# Patient Record
Sex: Female | Born: 1985 | Race: White | Hispanic: No | Smoking: Former smoker
Health system: Southern US, Community
[De-identification: ages and names within clinical notes are randomized; demographics above are authoritative.]

## PROBLEM LIST (undated history)

## (undated) ENCOUNTER — Inpatient Hospital Stay: Payer: Self-pay

## (undated) DIAGNOSIS — Z8619 Personal history of other infectious and parasitic diseases: Secondary | ICD-10-CM

## (undated) DIAGNOSIS — F329 Major depressive disorder, single episode, unspecified: Secondary | ICD-10-CM

## (undated) DIAGNOSIS — F32A Depression, unspecified: Secondary | ICD-10-CM

## (undated) DIAGNOSIS — F419 Anxiety disorder, unspecified: Secondary | ICD-10-CM

## (undated) HISTORY — DX: Personal history of other infectious and parasitic diseases: Z86.19

## (undated) HISTORY — PX: EAR FOREIGN BODY REMOVAL: SUR1103

## (undated) HISTORY — DX: Major depressive disorder, single episode, unspecified: F32.9

## (undated) HISTORY — DX: Depression, unspecified: F32.A

## (undated) HISTORY — DX: Anxiety disorder, unspecified: F41.9

---

## 2014-06-20 ENCOUNTER — Emergency Department: Payer: Self-pay | Admitting: Emergency Medicine

## 2014-06-20 LAB — URINALYSIS, COMPLETE
Bilirubin,UR: NEGATIVE
GLUCOSE, UR: NEGATIVE mg/dL (ref 0–75)
Ketone: NEGATIVE
NITRITE: POSITIVE
Ph: 6 (ref 4.5–8.0)
Specific Gravity: 1.027 (ref 1.003–1.030)
Squamous Epithelial: 5
WBC UR: 20 /HPF (ref 0–5)

## 2014-12-12 ENCOUNTER — Ambulatory Visit: Payer: Medicaid Other | Admitting: Obstetrics and Gynecology

## 2014-12-25 ENCOUNTER — Ambulatory Visit: Payer: Self-pay | Admitting: Obstetrics and Gynecology

## 2015-03-04 ENCOUNTER — Ambulatory Visit: Payer: Medicaid Other | Admitting: Obstetrics and Gynecology

## 2015-03-12 ENCOUNTER — Ambulatory Visit: Payer: Medicaid Other | Admitting: Obstetrics and Gynecology

## 2015-03-20 ENCOUNTER — Ambulatory Visit (INDEPENDENT_AMBULATORY_CARE_PROVIDER_SITE_OTHER): Payer: Medicaid Other | Admitting: Obstetrics and Gynecology

## 2015-03-20 ENCOUNTER — Encounter: Payer: Self-pay | Admitting: Obstetrics and Gynecology

## 2015-03-20 VITALS — BP 121/80 | HR 91 | Ht 65.0 in | Wt 148.3 lb

## 2015-03-20 DIAGNOSIS — Z308 Encounter for other contraceptive management: Secondary | ICD-10-CM

## 2015-03-20 MED ORDER — NORELGESTROMIN-ETH ESTRADIOL 150-35 MCG/24HR TD PTWK: 1.0000 | MEDICATED_PATCH | TRANSDERMAL | Status: DC

## 2015-03-20 NOTE — Progress Notes (Signed)
Subjective:    Heather Cross is a 29 y.o. G23P1001 female who presents for f/u of contraception.  Had Nexplanon removed and was initiated on Seasonale ~ 3 months ago.  The patient has no complaints today.  Notes that the pills work for her (i.e. no adverse side effects), however is sometimes forgetting to take them.  Has tried setting alarms in phone for reminders, but this does not help.  Desires to switch to an alternative form of contraception.   Menstrual History: OB History    Gravida Para Term Preterm AB TAB SAB Ectopic Multiple Living   1 1 1       1       Patient's last menstrual period was 03/09/2015 (approximate).    The following portions of the patient's history were reviewed and updated as appropriate: allergies, current medications, past family history, past medical history, past social history, past surgical history and problem list.  Review of Systems Pertinent items noted in HPI and remainder of comprehensive ROS otherwise negative.   Objective:  Blood pressure 121/80, pulse 91, height 5\' 5"  (1.651 m), weight 148 lb 5 oz (67.274 kg), last menstrual period 03/09/2015.   No exam performed today, not indicated  Assessment:    29 y.o., discontinuing OCP (estrogen/progesterone), no contraindications.   Plan:  Reviewed all forms of birth control options available including abstinence; over the counter/barrier methods; hormonal contraceptive medication including pill, patch, ring, injection,contraceptive implant; hormonal and nonhormonal IUDs; permanent sterilization options including vasectomy and the various tubal sterilization modalities. Risks and benefits reviewed.  Questions were answered. Patient now desires to try the contraceptive patch.  Information sheet given for patient to review.  Will prescribe.  Can begin on patch today as she just initiated a new pack of OCPs this week.  Advised on backup (barrier) method this month until next cycle.   Follow up as needed.     A total of 10 minutes were spent face-to-face with the patient during this encounter and over half of that time dealt with counseling and coordination of care.  Rubie Maid, MD [facility]

## 2015-03-20 NOTE — Addendum Note (Signed)
Addended by: Augusto Gamble on: 03/20/2015 11:17 AM   Modules accepted: Orders, Medications

## 2015-08-04 ENCOUNTER — Ambulatory Visit: Payer: Medicaid Other | Admitting: Obstetrics and Gynecology

## 2015-08-25 ENCOUNTER — Ambulatory Visit: Payer: Medicaid Other | Admitting: Obstetrics and Gynecology

## 2015-09-05 DIAGNOSIS — Z8619 Personal history of other infectious and parasitic diseases: Secondary | ICD-10-CM

## 2015-09-05 HISTORY — DX: Personal history of other infectious and parasitic diseases: Z86.19

## 2015-09-09 ENCOUNTER — Encounter: Payer: Self-pay | Admitting: Obstetrics and Gynecology

## 2015-09-09 ENCOUNTER — Ambulatory Visit (INDEPENDENT_AMBULATORY_CARE_PROVIDER_SITE_OTHER): Payer: Medicaid Other | Admitting: Obstetrics and Gynecology

## 2015-09-09 VITALS — BP 121/73 | HR 106 | Ht 65.0 in | Wt 149.9 lb

## 2015-09-09 DIAGNOSIS — N926 Irregular menstruation, unspecified: Secondary | ICD-10-CM | POA: Diagnosis not present

## 2015-09-09 DIAGNOSIS — Z3009 Encounter for other general counseling and advice on contraception: Secondary | ICD-10-CM | POA: Diagnosis not present

## 2015-09-09 DIAGNOSIS — N930 Postcoital and contact bleeding: Secondary | ICD-10-CM | POA: Diagnosis not present

## 2015-09-09 DIAGNOSIS — N841 Polyp of cervix uteri: Secondary | ICD-10-CM | POA: Diagnosis not present

## 2015-09-09 NOTE — Progress Notes (Signed)
    GYNECOLOGY PROGRESS NOTE  Subjective:    Patient ID: Heather Cross, female    DOB: [birth year], 30 y.o.   MRN: [id number]  HPI  Patient is a 30 y.o. G3P1001 female who presents for complaints of post-coital spotting x 2 months, and irregular menses.  Was previously on contraceptive patches, however notes that she was not able to afford them over the past several months.  Did not use any other alternative method of contraception.  Denies vaginal discharge, pelvic pain.  Notes that she had a menstrual cycle in February, and then none in March.  Thinks that menses may have started today.    The following portions of the patient's history were reviewed and updated as appropriate: allergies, current medications, past family history, past medical history, past social history, past surgical history and problem list.  Review of Systems Pertinent items noted in HPI and remainder of comprehensive ROS otherwise negative.   Objective:   Blood pressure 121/73, pulse 106, height 5\' 5"  (1.651 m), weight 149 lb 14.4 oz (67.994 kg), last menstrual period 09/09/2015. General appearance: alert and distracted Abdomen: soft, non-tender; bowel sounds normal; no masses,  no organomegaly Pelvic: cervix normal in appearance, external genitalia normal, no adnexal masses or tenderness, no cervical motion tenderness, positive findings: cervical polyp, rectovaginal septum normal, uterus normal size, shape, and consistency and vagina normal without discharge but with moderate amount of blood in vaginal vault.  Extremities: extremities normal, atraumatic, no cyanosis or edema Neurologic: Grossly normal   Labs:   POCT urine pregnancy  Result Value Ref Range   Preg Test, Ur Negative Negative    Assessment:   Post-coital bleeding Cervical polyp Irregular menses Contraception  Plan:   1) Suspected small cervical polyp visualized on today's exam.  Cervical polypectomy performed.  Is likely cause of  postcoital bleeding. Also performed Nuswab to r/o possible infectious cause of abnormal bleeding.  2) Irregular menses over past several months.  Unclear cause.  Patient was using contraceptive patch, but has not used in the past several months. Can be regulated again with hormonal contraception.  3) Contraception - patient has not been using contraceptive patches as prescribed for the past 3 months due to cost.  Has not attempted to use any other form of contraception (i.e. Condoms).  Is non-compliant with pills, declines use of IUD and Provera because she is "araid of them", and has had Nexplanon in place, but removed due to persistent bleeding.  Patient unsure as to what she desires for future contraception.  As soon as partner becomes employed again, notes that she desires to continue the contraceptive patch.  Advised on condoms in the meantime.  [facility]  To call back in 1 week for test results.  To f/u as needed, or if symptoms worsen.   Rubie Maid, MD [facility]

## 2015-09-11 LAB — PATHOLOGY

## 2015-09-11 LAB — POCT URINE PREGNANCY: Preg Test, Ur: NEGATIVE

## 2015-09-12 LAB — NUSWAB VAGINITIS PLUS (VG+)
Atopobium vaginae: HIGH Score — AB
BVAB 2: HIGH Score — AB
CANDIDA ALBICANS, NAA: NEGATIVE
CANDIDA GLABRATA, NAA: NEGATIVE
Chlamydia trachomatis, NAA: NEGATIVE
Megasphaera 1: HIGH Score — AB
NEISSERIA GONORRHOEAE, NAA: POSITIVE — AB
TRICH VAG BY NAA: NEGATIVE

## 2015-09-14 ENCOUNTER — Telehealth: Payer: Self-pay | Admitting: Obstetrics and Gynecology

## 2015-09-14 NOTE — Telephone Encounter (Signed)
She received her test results on MyChart and she something is not adding up.pleae call her.

## 2015-09-14 NOTE — Telephone Encounter (Signed)
Called pt she states that she is very upset about her +STD results. Pt states that she does not understand how she could be positive as she has been faithful in a single partner relationship. Pt states she does not believe the dx as she does not have any sign and symptoms nor does her partner. Reiterated to pt several times that her abnormal bleeding could be a sign of the infection, as well as the fact that some people do not always show signs or symptoms of the infection. Advised pt that she is indeed + for the infection and needs treatment as well as her partner. Pt states that she is unsure of coming here for treatment as she is unsure about the infection as well as cost. Advised pt of the need to [facility] if cost is an issue for her as they can treat her on a sliding scale and if she is integent can receive care for free. Pt states she will call her boyfriend and then call me back to inform me of their plans.

## 2015-11-12 ENCOUNTER — Encounter: Payer: Self-pay | Admitting: Obstetrics and Gynecology

## 2015-11-12 ENCOUNTER — Ambulatory Visit (INDEPENDENT_AMBULATORY_CARE_PROVIDER_SITE_OTHER): Payer: Medicaid Other | Admitting: Obstetrics and Gynecology

## 2015-11-12 VITALS — BP 110/76 | HR 106 | Ht 65.5 in | Wt 149.8 lb

## 2015-11-12 DIAGNOSIS — O219 Vomiting of pregnancy, unspecified: Secondary | ICD-10-CM

## 2015-11-12 DIAGNOSIS — Z3201 Encounter for pregnancy test, result positive: Secondary | ICD-10-CM

## 2015-11-12 LAB — POCT URINE PREGNANCY: Preg Test, Ur: POSITIVE — AB

## 2015-11-12 MED ORDER — CITRANATAL HARMONY 27-1-260 MG PO CAPS: 27.0000 mg | ORAL_CAPSULE | Freq: Every day | ORAL | Status: DC

## 2015-11-12 MED ORDER — PROMETHAZINE HCL 25 MG PO TABS: 25.0000 mg | ORAL_TABLET | Freq: Four times a day (QID) | ORAL | Status: DC | PRN

## 2015-11-12 NOTE — Progress Notes (Signed)
    GYNECOLOGY CLINIC PROGRESS NOTE  Subjective:    Heather Cross is a 30 y.o. G53P1001 female who presents for evaluation of amenorrhea. She believes she could be pregnant. Pregnancy is desired. Sexual Activity: single partner, contraception: none. Current symptoms also include: breast tenderness, nausea and positive home pregnancy test. Last period was normal.  Patient brought in pregnancy test from home which was positive.    Patient's last menstrual period was 10/02/2015 (approximate).    The following portions of the patient's history were reviewed and updated as appropriate: allergies, current medications, past family history, past medical history, past social history, past surgical history and problem list.  Review of Systems Pertinent items noted in HPI and remainder of comprehensive ROS otherwise negative.     Objective:    BP 110/76 mmHg  Pulse 106  Ht 5' 5.5" (1.664 m)  Wt 149 lb 12.8 oz (67.949 kg)  BMI 24.54 kg/m2  LMP 10/02/2015 (Approximate) General: alert, no distress and no acute distress    Lab Review Urine HCG: positive    Assessment:    Absence of menstruation.    Pregnancy  Plan:    Pregnancy Test: Positive: EDC: 07/08/2016, current EGA [redacted]w[redacted]d by dates. Briefly discussed pre-natal care options. Pregnancy, Childbirth and the Newborn book given. Encouraged well-balanced diet, plenty of rest when needed, pre-natal vitamins daily and walking for exercise. Discussed self-help for nausea, avoiding OTC medications until consulting provider or pharmacist, other than Tylenol as needed, minimal caffeine (1-2 cups daily) and avoiding alcohol. She will schedule her OB nurse intake visit in the next month. Feel free to call with any questions.   Rubie Maid, MD [facility]

## 2015-11-17 ENCOUNTER — Encounter: Payer: Self-pay | Admitting: Obstetrics and Gynecology

## 2015-11-17 ENCOUNTER — Ambulatory Visit (INDEPENDENT_AMBULATORY_CARE_PROVIDER_SITE_OTHER): Payer: Medicaid Other | Admitting: Obstetrics and Gynecology

## 2015-11-17 ENCOUNTER — Other Ambulatory Visit (INDEPENDENT_AMBULATORY_CARE_PROVIDER_SITE_OTHER): Payer: Medicaid Other

## 2015-11-17 VITALS — BP 120/67 | HR 97 | Ht 65.0 in | Wt 149.8 lb

## 2015-11-17 DIAGNOSIS — Z113 Encounter for screening for infections with a predominantly sexual mode of transmission: Secondary | ICD-10-CM | POA: Diagnosis not present

## 2015-11-17 DIAGNOSIS — O4691 Antepartum hemorrhage, unspecified, first trimester: Secondary | ICD-10-CM

## 2015-11-17 DIAGNOSIS — O98211 Gonorrhea complicating pregnancy, first trimester: Secondary | ICD-10-CM

## 2015-11-17 DIAGNOSIS — O2341 Unspecified infection of urinary tract in pregnancy, first trimester: Secondary | ICD-10-CM

## 2015-11-17 DIAGNOSIS — O209 Hemorrhage in early pregnancy, unspecified: Secondary | ICD-10-CM

## 2015-11-17 LAB — POCT URINALYSIS DIPSTICK
BILIRUBIN UA: NEGATIVE
GLUCOSE UA: NEGATIVE
KETONES UA: NEGATIVE
Nitrite, UA: POSITIVE
Protein, UA: NEGATIVE
SPEC GRAV UA: 1.025
Urobilinogen, UA: NEGATIVE
pH, UA: 6

## 2015-11-17 MED ORDER — AZITHROMYCIN 500 MG PO TABS: 500.0000 mg | ORAL_TABLET | Freq: Once | ORAL | Status: DC

## 2015-11-17 MED ORDER — CEFTRIAXONE SODIUM 1 G IJ SOLR
250.0000 mg | INTRAMUSCULAR | Status: DC
  Administered 2015-11-17: 250 mg via INTRAMUSCULAR

## 2015-11-17 MED ORDER — NITROFURANTOIN MONOHYD MACRO 100 MG PO CAPS: 100.0000 mg | ORAL_CAPSULE | Freq: Two times a day (BID) | ORAL | Status: DC

## 2015-11-17 NOTE — Progress Notes (Signed)
    GYNECOLOGY PROGRESS NOTE  Subjective:    Patient ID: Heather Cross, female    DOB: 07/23/1985, 30 y.o.   MRN: [id number]  HPI  Patient is a 30 y.o. G2P1001 female at approximately [redacted] weeks gestation, LMP 10/02/2015 (approximate), who presents for treatment for STD.  Was diagnosed in April 2017 with Gonorrhea and was notified by phone, however patient was in denial and notes that she did not follow up for treatment.  Now that she is pregnant, she states that she now wants to get treated in fear of passing it on to the baby.   The following portions of the patient's history were reviewed and updated as appropriate: allergies, current medications, past family history, past medical history, past social history, past surgical history and problem list.  Review of Systems A comprehensive review of systems was negative except for: Genitourinary: positive for urinary odor and vaginal spotting   Objective:   Blood pressure 120/67, pulse 97, height 5\' 5"  (1.651 m), weight 149 lb 12.8 oz (67.949 kg), last menstrual period 10/02/2015. General appearance: alert and no distress, appears disheveled Abdomen: soft, non-tender; bowel sounds normal; no masses,  no organomegaly Pelvic: external genitalia normal, rectovaginal septum normal.  Vagina with small amount of white thin discharge, no odor.  Cervix normal appearing, no lesions and no motion tenderness.  Uterus mobile, nontender, normal shape and size.  Adnexae non-palpable, nontender bilaterally. Extremities: extremities normal, atraumatic, no cyanosis or edema Neurologic: Grossly normal   Assessment:   Gonorrhea infection STD screening Pregnancy  Plan:   - Treated for Gonorrhea with 250 mg of Ceftriaxone, will prescribe Azithromycin 1 gram PO x 1 dose. Patient encouraged to maintain abstinence until both partners treated, and for at least 1 week after. Advised on barrier protection.  - Will re-screen patient for other STI's (Nuswab  performed today, declines blood draw for serologic screening as she is afraid of needles and is already receiving 1 injection today.  Will have performed with NOB labs).    Rubie Maid, MD [facility]

## 2015-11-19 LAB — URINE CULTURE

## 2015-11-20 DIAGNOSIS — O2341 Unspecified infection of urinary tract in pregnancy, first trimester: Secondary | ICD-10-CM | POA: Insufficient documentation

## 2015-11-21 LAB — NUSWAB VAGINITIS PLUS (VG+)
Atopobium vaginae: HIGH Score — AB
CANDIDA ALBICANS, NAA: NEGATIVE
CHLAMYDIA TRACHOMATIS, NAA: NEGATIVE
Candida glabrata, NAA: NEGATIVE
Megasphaera 1: HIGH Score — AB
NEISSERIA GONORRHOEAE, NAA: POSITIVE — AB
Trich vag by NAA: NEGATIVE

## 2015-11-24 ENCOUNTER — Telehealth: Payer: Self-pay

## 2015-11-24 ENCOUNTER — Encounter: Payer: Self-pay | Admitting: Obstetrics and Gynecology

## 2015-11-24 DIAGNOSIS — B9689 Other specified bacterial agents as the cause of diseases classified elsewhere: Secondary | ICD-10-CM

## 2015-11-24 DIAGNOSIS — N760 Acute vaginitis: Principal | ICD-10-CM

## 2015-11-24 MED ORDER — METRONIDAZOLE 0.75 % VA GEL: 1.0000 | Freq: Two times a day (BID) | VAGINAL | Status: DC

## 2015-11-24 NOTE — Telephone Encounter (Signed)
-----   Message from Rubie Maid, MD sent at 11/20/2015  8:33 PM EDT ----- Patient with BV infection, will need treatment with Flagyl 500 mg BID x 7 days  or Metrogel qhs x 5 nights

## 2015-11-24 NOTE — Telephone Encounter (Signed)
Called pt, number is disconnected. Will send mychart message. RX sent.

## 2015-12-11 ENCOUNTER — Ambulatory Visit (INDEPENDENT_AMBULATORY_CARE_PROVIDER_SITE_OTHER): Payer: Medicaid Other | Admitting: Obstetrics and Gynecology

## 2015-12-11 VITALS — BP 96/65 | HR 98 | Wt 150.2 lb

## 2015-12-11 DIAGNOSIS — Z369 Encounter for antenatal screening, unspecified: Secondary | ICD-10-CM

## 2015-12-11 DIAGNOSIS — Z1389 Encounter for screening for other disorder: Secondary | ICD-10-CM

## 2015-12-11 DIAGNOSIS — Z3490 Encounter for supervision of normal pregnancy, unspecified, unspecified trimester: Secondary | ICD-10-CM

## 2015-12-11 DIAGNOSIS — Z3481 Encounter for supervision of other normal pregnancy, first trimester: Secondary | ICD-10-CM

## 2015-12-11 DIAGNOSIS — Z36 Encounter for antenatal screening of mother: Secondary | ICD-10-CM

## 2015-12-11 DIAGNOSIS — Z113 Encounter for screening for infections with a predominantly sexual mode of transmission: Secondary | ICD-10-CM

## 2015-12-11 DIAGNOSIS — N390 Urinary tract infection, site not specified: Secondary | ICD-10-CM

## 2015-12-11 DIAGNOSIS — O98211 Gonorrhea complicating pregnancy, first trimester: Secondary | ICD-10-CM

## 2015-12-11 NOTE — Patient Instructions (Signed)
Gonorrhea Gonorrhea is an infection that can cause serious problems. If left untreated, the infection may:   Damage the female or female organs.   Cause women to be unable to have children (sterility).   Harm a fetus if the infected woman is pregnant.  It is important to get treatment for gonorrhea as soon as possible. It is also necessary that all your sexual partners be tested for the infection.  CAUSES  Gonorrhea is caused by bacteria called Neisseria gonorrhoeae. The infection is spread from person to person, usually by sexual contact (such as by anal, vaginal, or oral means). A newborn can contract the infection from his or her mother during birth.  RISK FACTORS  Being a woman younger than 29 years of age who is sexually active.  Being a woman 75 years of age or older who has:  A new sex partner.  More than one sex partner.  A sex partner who has a sexually transmitted disease (STD).  Using condoms inconsistently.  Currently having, or having previously had, an STD.  Exchanging sex or money or drugs. SYMPTOMS  Some people with gonorrhea do not have symptoms. Symptoms may be different in females and males.  Females The most common symptoms are:   Pain in the lower abdomen.   Fever with or without chills.  Other symptoms include:   Abnormal vaginal discharge.   Painful intercourse.   Burning or itching of the vagina or lips of the vagina.   Abnormal vaginal bleeding.   Pain when urinating.   Long-lasting (chronic) pain in the lower abdomen, especially during menstruation or intercourse.   Inability to become pregnant.   Going into premature labor.   Irritation, pain, bleeding, or discharge from the rectum. This may occur if the infection was spread by anal sex.   Sore throat or swollen lymph nodes in the neck. This may occur if the infection was spread by oral sex.  Males The most common symptoms are:   Discharge from the penis.   Pain  or burning during urination.   Pain or swelling in the testicles. Other symptoms may include:   Irritation, pain, bleeding, or discharge from the rectum. This may occur if the infection was spread by anal sex.   Sore throat, fever, or swollen lymph nodes in the neck. This may occur if the infection was spread by oral sex.  DIAGNOSIS  A diagnosis is made after a physical exam is done and a sample of discharge is examined under a microscope for the presence of the bacteria. The discharge may be taken from the urethra, cervix, throat, or rectum.  TREATMENT  Gonorrhea is treated with antibiotic medicines. It is important for treatment to begin as soon as possible. Early treatment may prevent some problems from developing. Do not have sex. Avoid all types of sexual activity for 7 days after treatment is complete and until any sex partners have been treated. HOME CARE INSTRUCTIONS   Take medicines only as directed by your health care provider.   Take your antibiotic medicine as directed by your health care provider. Finish the antibiotic even if you start to feel better. Incomplete treatment will put you at risk for continued infection.   Do not have sex until treatment is complete or as directed by your health care provider.   Keep all follow-up visits as directed by your health care provider.   Not all test results are available during your visit. If your test results are not back  during the visit, make an appointment with your health care provider to find out the results. Do not assume everything is normal if you have not heard from your health care provider or the medical facility. It is your responsibility to get your test results.  If you test positive for gonorrhea, inform your recent sexual partners. They need to be checked for gonorrhea even if they do not have symptoms. They may need treatment, even if they test negative for gonorrhea.  SEEK MEDICAL CARE IF:   You develop any  bad reaction to the medicine you were prescribed. This may include:   A rash.   Nausea.   Vomiting.   Diarrhea.   Your symptoms do not improve after a few days of taking antibiotics.   Your symptoms get worse.   You develop increased pain, such as in the testicles (for males) or in the abdomen (for females).  You have a fever. MAKE SURE YOU:   Understand these instructions.  Will watch your condition.  Will get help right away if you are not doing well or get worse.   This information is not intended to replace advice given to you by your health care provider. Make sure you discuss any questions you have with your health care provider.   Document Released: 05/20/2000 Document Revised: 06/13/2014 Document Reviewed: 11/28/2012 Elsevier Interactive Patient Education 2016 [employer]. Pregnancy and Zika Virus Disease Zika virus disease, or Zika, is an illness that can spread to people from mosquitoes that carry the virus. It may also spread from person to person through infected body fluids. Zika first occurred in [place], but recently it has spread to new areas. The virus occurs in tropical climates. The location of Zika continues to change. Most people who become infected with Zika virus do not develop serious illness. However, Zika may cause birth defects in an unborn baby whose mother is infected with the virus. It may also increase the risk of miscarriage. WHAT ARE THE SYMPTOMS OF ZIKA VIRUS DISEASE? In many cases, people who have been infected with Zika virus do not develop any symptoms. If symptoms appear, they usually start about a week after the person is infected. Symptoms are usually mild. They may include:  Fever.  Rash.  Red eyes.  Joint pain. HOW DOES ZIKA VIRUS DISEASE SPREAD? The main way that [place] virus spreads is through the bite of a certain type of mosquito. Unlike most types of mosquitos, which bite only at night, the type of mosquito that carries  Zika virus bites both at night and during the day. Zika virus can also spread through sexual contact, through a blood transfusion, and from a mother to her baby before or during birth. Once you have had Zika virus disease, it is unlikely that you will get it again. CAN I PASS ZIKA TO MY BABY DURING PREGNANCY? Yes, Zika can pass from a mother to her baby before or during birth. WHAT PROBLEMS CAN ZIKA CAUSE FOR MY BABY? A woman who is infected with Zika virus while pregnant is at risk of having her baby born with a condition in which the brain or head is smaller than expected (microcephaly). Babies who have microcephaly can have developmental delays, seizures, hearing problems, and vision problems. Having Zika virus disease during pregnancy can also increase the risk of miscarriage. HOW CAN ZIKA VIRUS DISEASE BE PREVENTED? There is no vaccine to prevent Zika. The best way to prevent the disease is to avoid infected mosquitoes and avoid  exposure to body fluids that can spread the virus. Avoid any possible exposure to [place] by taking the following precautions. For women and their sex partners:  Avoid traveling to high-risk areas. The locations where [place] is being reported change often. To identify high-risk areas, check the CDC travel website: CreditChaos.com.ee  If you or your sex partner must travel to a high-risk area, talk with a health care provider before and after traveling.  Take all precautions to avoid mosquito bites if you live in, or travel to, any of the high-risk areas. Insect repellents are safe to use during pregnancy.  Ask your health care provider when it is safe to have sexual contact. For women:  If you are pregnant or trying to become pregnant, avoid sexual contact with persons who may have been exposed to [place] virus, persons who have possible symptoms of Zika, or persons whose history you are unsure about. If you choose to have sexual contact with someone who may  have been exposed to [place] virus, use condoms correctly during the entire duration of sexual activity, every time. Do not share sexual devices, as you may be exposed to body fluids.  Ask your health care provider about when it is safe to attempt pregnancy after a possible exposure to [place] virus. WHAT STEPS SHOULD I TAKE TO AVOID MOSQUITO BITES? Take these steps to avoid mosquito bites when you are in a high-risk area:  Wear loose clothing that covers your arms and legs.  Limit your outdoor activities.  Do not open windows unless they have window screens.  Sleep under mosquito nets.  Use insect repellent. The best insect repellents have:  DEET, picaridin, oil of lemon eucalyptus (OLE), or [id number] in them.  Higher amounts of an active ingredient in them.  Remember that insect repellents are safe to use during pregnancy.  Do not use OLE on children who are younger than 55 years of age. Do not use insect repellent on babies who are younger than 48 months of age.  Cover your child's stroller with mosquito netting. Make sure the netting fits snugly and that any loose netting does not cover your child's mouth or nose. Do not use a blanket as a mosquito-protection cover.  Do not apply insect repellent underneath clothing.  If you are using sunscreen, apply the sunscreen before applying the insect repellent.  Treat clothing with permethrin. Do not apply permethrin directly to your skin. Follow label directions for safe use.  Get rid of standing water, where mosquitoes may reproduce. Standing water is often found in items such as buckets, bowls, animal food dishes, and flowerpots. When you return from traveling to any high-risk area, continue taking actions to protect yourself against mosquito bites for 3 weeks, even if you show no signs of illness. This will prevent spreading Zika virus to uninfected mosquitoes. WHAT SHOULD I KNOW ABOUT THE SEXUAL TRANSMISSION OF ZIKA? People can spread Zika to  their sexual partners during vaginal, anal, or oral sex, or by sharing sexual devices. Many people with [place] do not develop symptoms, so a person could spread the disease without knowing that they are infected. The greatest risk is to women who are pregnant or who may become pregnant. Zika virus can live longer in semen than it can live in blood. Couples can prevent sexual transmission of the virus by:  Using condoms correctly during the entire duration of sexual activity, every time. This includes vaginal, anal, and oral sex.  Not sharing sexual devices. Sharing increases your  risk of being exposed to body fluid from another person.  Avoiding all sexual activity until your health care provider says it is safe. SHOULD I BE TESTED FOR ZIKA VIRUS? A sample of your blood can be tested for Zika virus. A pregnant woman should be tested if she may have been exposed to the virus or if she has symptoms of Zika. She may also have additional tests done during her pregnancy, such ultrasound testing. Talk with your health care provider about which tests are recommended.   This information is not intended to replace advice given to you by your health care provider. Make sure you discuss any questions you have with your health care provider.   Document Released: 02/11/2015 Document Reviewed: 02/04/2015 Elsevier Interactive Patient Education 2016 [place] and Medications in Pregnancy  Cold/Flu:  Sudafed for congestion- Robitussin (plain) for cough- Tylenol for discomfort.  Please follow the directions on the label.  Try not to take any more than needed.  OTC Saline nasal spray and air humidifier or cool-mist  Vaporizer to sooth nasal irritation and to loosen congestion.  It is also important to increase intake of non carbonated fluids, especially if you have a fever.  Constipation:  Colace-2 capsules at bedtime; Metamucil- follow directions on label; Senokot- 1 tablet at bedtime.  Any one  of these medications can be used.  It is also very important to increase fluids and fruits along with regular exercise.  If problem persists please call the office.  Diarrhea:  Kaopectate as directed on the label.  Eat a bland diet and increase fluids.  Avoid highly seasoned foods.  Headache:  Tylenol 1 or 2 tablets every 3-4 hours as needed  Indigestion:  Maalox, Mylanta, Tums or Rolaids- as directed on label.  Also try to eat small meals and avoid fatty, greasy or spicy foods.  Nausea with or without Vomiting:  Nausea in pregnancy is caused by increased levels of hormones in the body which influence the digestive system and cause irritation when stomach acids accumulate.  Symptoms usually subside after 1st trimester of pregnancy.  Try the following:  Keep saltines, graham crackers or dry toast by your bed to eat upon awakening.  Don't let your stomach get empty.  Try to eat 5-6 small meals per day instead of 3 large ones.  Avoid greasy fatty or highly seasoned foods.   Take OTC Unisom 1 tablet at bed time along with OTC Vitamin B6 25-50 mg 3 times per day.    If nausea continues with vomiting and you are unable to keep down food and fluids you may need a prescription medication.  Please notify your provider.   Sore throat:  Chloraseptic spray, throat lozenges and or plain Tylenol.  Vaginal Yeast Infection:  OTC Monistat for 7 days as directed on label.  If symptoms do not resolve within a week notify provider.  If any of the above problems do not subside with recommended treatment please call the office for further assistance.   Do not take Aspirin, Advil, Motrin or Ibuprofen.  * * OTC= Over the counter Hyperemesis Gravidarum Hyperemesis gravidarum is a severe form of nausea and vomiting that happens during pregnancy. Hyperemesis is worse than morning sickness. It may cause you to have nausea or vomiting all day for many days. It may keep you from eating and drinking enough food and  liquids. Hyperemesis usually occurs during the first half (the first 20 weeks) of pregnancy. It often goes away once  a woman is in her second half of pregnancy. However, sometimes hyperemesis continues through an entire pregnancy.  CAUSES  The cause of this condition is not completely known but is thought to be related to changes in the body's hormones when pregnant. It could be from the high level of the pregnancy hormone or an increase in estrogen in the body.  SIGNS AND SYMPTOMS   Severe nausea and vomiting.  Nausea that does not go away.  Vomiting that does not allow you to keep any food down.  Weight loss and body fluid loss (dehydration).  Having no desire to eat or not liking food you have previously enjoyed. DIAGNOSIS  Your health care provider will do a physical exam and ask you about your symptoms. He or she may also order blood tests and urine tests to make sure something else is not causing the problem.  TREATMENT  You may only need medicine to control the problem. If medicines do not control the nausea and vomiting, you will be treated in the hospital to prevent dehydration, increased acid in the blood (acidosis), weight loss, and changes in the electrolytes in your body that may harm the unborn baby (fetus). You may need IV fluids.  HOME CARE INSTRUCTIONS   Only take over-the-counter or prescription medicines as directed by your health care provider.  Try eating a couple of dry crackers or toast in the morning before getting out of bed.  Avoid foods and smells that upset your stomach.  Avoid fatty and spicy foods.  Eat 5-6 small meals a day.  Do not drink when eating meals. Drink between meals.  For snacks, eat high-protein foods, such as cheese.  Eat or suck on things that have ginger in them. Ginger helps nausea.  Avoid food preparation. The smell of food can spoil your appetite.  Avoid iron pills and iron in your multivitamins until after 3-4 months of being  pregnant. However, consult with your health care provider before stopping any prescribed iron pills. SEEK MEDICAL CARE IF:   Your abdominal pain increases.  You have a severe headache.  You have vision problems.  You are losing weight. SEEK IMMEDIATE MEDICAL CARE IF:   You are unable to keep fluids down.  You vomit blood.  You have constant nausea and vomiting.  You have excessive weakness.  You have extreme thirst.  You have dizziness or fainting.  You have a fever or persistent symptoms for more than 2-3 days.  You have a fever and your symptoms suddenly get worse. MAKE SURE YOU:   Understand these instructions.  Will watch your condition.  Will get help right away if you are not doing well or get worse.   This information is not intended to replace advice given to you by your health care provider. Make sure you discuss any questions you have with your health care provider.   Document Released: 05/23/2005 Document Revised: 03/13/2013 Document Reviewed: 01/02/2013 Elsevier Interactive Patient Education 2016 [employer]. Commonly Asked Questions During Pregnancy  Cats: A parasite can be excreted in cat feces.  To avoid exposure you need to have another person empty the little box.  If you must empty the litter box you will need to wear gloves.  Wash your hands after handling your cat.  This parasite can also be found in raw or undercooked meat so this should also be avoided.  Colds, Sore Throats, Flu: Please check your medication sheet to see what you can take for symptoms.  If your symptoms are unrelieved by these medications please call the office.  Dental Work: Most any dental work [employer] recommends is permitted.  X-rays should only be taken during the first trimester if absolutely necessary.  Your abdomen should be shielded with a lead apron during all x-rays.  Please notify your provider prior to receiving any x-rays.  Novocaine is fine; gas is not  recommended.  If your dentist requires a note from [place] prior to dental work please call the office and we will provide one for you.  Exercise: Exercise is an important part of staying healthy during your pregnancy.  You may continue most exercises you were accustomed to prior to pregnancy.  Later in your pregnancy you will most likely notice you have difficulty with activities requiring balance like riding a bicycle.  It is important that you listen to your body and avoid activities that put you at a higher risk of falling.  Adequate rest and staying well hydrated are a must!  If you have questions about the safety of specific activities ask your provider.    Exposure to Children with illness: Try to avoid obvious exposure; report any symptoms to [place] when noted,  If you have chicken pos, red measles or mumps, you should be immune to these diseases.   Please do not take any vaccines while pregnant unless you have checked with your OB provider.  Fetal Movement: After 28 weeks we recommend you do "kick counts" twice daily.  Lie or sit down in a calm quiet environment and count your baby movements "kicks".  You should feel your baby at least 10 times per hour.  If you have not felt 10 kicks within the first hour get up, walk around and have something sweet to eat or drink then repeat for an additional hour.  If count remains less than 10 per hour notify your provider.  Fumigating: Follow your pest control agent's advice as to how long to stay out of your home.  Ventilate the area well before re-entering.  Hemorrhoids:   Most over-the-counter preparations can be used during pregnancy.  Check your medication to see what is safe to use.  It is important to use a stool softener or fiber in your diet and to drink lots of liquids.  If hemorrhoids seem to be getting worse please call the office.   Hot Tubs:  Hot tubs Jacuzzis and saunas are not recommended while pregnant.  These increase your internal body  temperature and should be avoided.  Intercourse:  Sexual intercourse is safe during pregnancy as long as you are comfortable, unless otherwise advised by your provider.  Spotting may occur after intercourse; report any bright red bleeding that is heavier than spotting.  Labor:  If you know that you are in labor, please go to the hospital.  If you are unsure, please call the office and let us help you decide what to do.  Lifting, straining, etc:  If your job requires heavy lifting or straining please check with your provider for any limitations.  Generally, you should not lift items heavier than that you can lift simply with your hands and arms (no back muscles)  Painting:  Paint fumes do not harm your pregnancy, but may make you ill and should be avoided if possible.  Latex or water based paints have less odor than oils.  Use adequate ventilation while painting.  Permanents & Hair Color:  Chemicals in hair dyes are not recommended as they cause  increase hair dryness which can increase hair loss during pregnancy.  " Highlighting" and permanents are allowed.  Dye may be absorbed differently and permanents may not hold as well during pregnancy.  Sunbathing:  Use a sunscreen, as skin burns easily during pregnancy.  Drink plenty of fluids; avoid over heating.  Tanning Beds:  Because their possible side effects are still unknown, tanning beds are not recommended.  Ultrasound Scans:  Routine ultrasounds are performed at approximately 20 weeks.  You will be able to see your baby's general anatomy an if you would like to know the gender this can usually be determined as well.  If it is questionable when you conceived you may also receive an ultrasound early in your pregnancy for dating purposes.  Otherwise ultrasound exams are not routinely performed unless there is a medical necessity.  Although you can request a scan we ask that you pay for it when conducted because insurance does not cover " patient  request" scans.  Work: If your pregnancy proceeds without complications you may work until your due date, unless your physician or employer advises otherwise.  Round Ligament Pain/Pelvic Discomfort:  Sharp, shooting pains not associated with bleeding are fairly common, usually occurring in the second trimester of pregnancy.  They tend to be worse when standing up or when you remain standing for long periods of time.  These are the result of pressure of certain pelvic ligaments called "round ligaments".  Rest, Tylenol and heat seem to be the most effective relief.  As the womb and fetus grow, they rise out of the pelvis and the discomfort improves.  Please notify the office if your pain seems different than that described.  It may represent a more serious condition.

## 2015-12-11 NOTE — Progress Notes (Signed)
  Rise Paganini presents for NOB nurse interview visit. G-2.  P-1001. Pregnancy education material explained and given. No cats in the home. NOB labs ordered.  HIV labs and Drug screen were explained optional and she could opt out of tests but did not decline. Drug screen ordered/declined. PNV encouraged. NT to discuss with provider. Pt tested positive for gonnarrhea, UTI, and BV. Pt has not picked up her medication from the pharmacy except for the Metrogel and has used it. I stressed how important her taking her medication was and highly recommended her getting meds and to start right away. Pt's fiance states that they will do it. When I asked her why not she stated that she just hasn't been to [facility] and her fiance is working.  Pt. To follow up with provider in 2 weeks for NOB physical.  All questions answered.

## 2015-12-12 LAB — ANTIBODY SCREEN: ANTIBODY SCREEN: NEGATIVE

## 2015-12-12 LAB — CBC WITH DIFFERENTIAL/PLATELET
BASOS ABS: 0 10*3/uL (ref 0.0–0.2)
Basos: 1 %
EOS (ABSOLUTE): 0.1 10*3/uL (ref 0.0–0.4)
EOS: 1 %
HEMATOCRIT: 39.6 % (ref 34.0–46.6)
HEMOGLOBIN: 13.4 g/dL (ref 11.1–15.9)
Immature Grans (Abs): 0 10*3/uL (ref 0.0–0.1)
Immature Granulocytes: 0 %
LYMPHS ABS: 2.1 10*3/uL (ref 0.7–3.1)
Lymphs: 27 %
MCH: 29.6 pg (ref 26.6–33.0)
MCHC: 33.8 g/dL (ref 31.5–35.7)
MCV: 87 fL (ref 79–97)
MONOCYTES: 5 %
Monocytes Absolute: 0.4 10*3/uL (ref 0.1–0.9)
NEUTROS ABS: 5.4 10*3/uL (ref 1.4–7.0)
Neutrophils: 66 %
Platelets: 290 10*3/uL (ref 150–379)
RBC: 4.53 x10E6/uL (ref 3.77–5.28)
RDW: 13.7 % (ref 12.3–15.4)
WBC: 8.1 10*3/uL (ref 3.4–10.8)

## 2015-12-12 LAB — RUBELLA SCREEN: Rubella Antibodies, IGG: 4.07 index (ref >0.99)

## 2015-12-12 LAB — RPR: RPR: NONREACTIVE

## 2015-12-12 LAB — HIV ANTIBODY (ROUTINE TESTING W REFLEX): HIV Screen 4th Generation wRfx: NONREACTIVE

## 2015-12-12 LAB — VARICELLA ZOSTER ANTIBODY, IGG: VARICELLA: 699 {index} (ref >165)

## 2015-12-12 LAB — RH TYPE: Rh Factor: POSITIVE

## 2015-12-12 LAB — ABO

## 2015-12-12 LAB — HEPATITIS B SURFACE ANTIGEN: Hepatitis B Surface Ag: NEGATIVE

## 2015-12-13 LAB — MONITOR DRUG PROFILE 14(MW)
Amphetamine Scrn, Ur: NEGATIVE ng/mL
BARBITURATE SCREEN URINE: NEGATIVE ng/mL
BENZODIAZEPINE SCREEN, URINE: NEGATIVE ng/mL
BUPRENORPHINE, URINE: NEGATIVE ng/mL
CANNABINOIDS UR QL SCN: NEGATIVE ng/mL
COCAINE(METAB.)SCREEN, URINE: NEGATIVE ng/mL
CREATININE(CRT), U: 176.4 mg/dL (ref 20.0–300.0)
Fentanyl, Urine: NEGATIVE pg/mL
MEPERIDINE SCREEN, URINE: NEGATIVE ng/mL
METHADONE SCREEN, URINE: NEGATIVE ng/mL
OPIATE SCREEN URINE: NEGATIVE ng/mL
OXYCODONE+OXYMORPHONE UR QL SCN: NEGATIVE ng/mL
PHENCYCLIDINE QUANTITATIVE URINE: NEGATIVE ng/mL
Ph of Urine: 8.7 (ref 4.5–8.9)
Propoxyphene Scrn, Ur: NEGATIVE ng/mL
SPECIFIC GRAVITY: 1.018
TRAMADOL SCREEN, URINE: NEGATIVE ng/mL

## 2015-12-13 LAB — GC/CHLAMYDIA PROBE AMP
Chlamydia trachomatis, NAA: NEGATIVE
NEISSERIA GONORRHOEAE BY PCR: NEGATIVE

## 2015-12-13 LAB — URINALYSIS, ROUTINE W REFLEX MICROSCOPIC
BILIRUBIN UA: NEGATIVE
Glucose, UA: NEGATIVE
KETONES UA: NEGATIVE
Nitrite, UA: NEGATIVE
PH UA: 8 — AB (ref 5.0–7.5)
SPEC GRAV UA: 1.023 (ref 1.005–1.030)
UUROB: 1 mg/dL (ref 0.2–1.0)

## 2015-12-13 LAB — MICROSCOPIC EXAMINATION
CASTS: NONE SEEN /LPF
Epithelial Cells (non renal): >10 /hpf — AB (ref 0–10)

## 2015-12-13 LAB — URINE CULTURE, OB REFLEX

## 2015-12-13 LAB — NICOTINE SCREEN, URINE: Cotinine Ql Scrn, Ur: NEGATIVE ng/mL

## 2015-12-13 LAB — CULTURE, OB URINE

## 2015-12-14 ENCOUNTER — Encounter: Payer: Self-pay | Admitting: Obstetrics and Gynecology

## 2015-12-14 ENCOUNTER — Telehealth: Payer: Self-pay | Admitting: Obstetrics and Gynecology

## 2015-12-14 NOTE — Telephone Encounter (Signed)
She wants to know how to take her medication for her ghonerrea. She said please call today so she can take it

## 2015-12-14 NOTE — Telephone Encounter (Signed)
Called pt informed her that she needs to take both pills by mouth as a one time dose. Pt gave verbal understanding.

## 2015-12-23 ENCOUNTER — Encounter: Payer: Self-pay | Admitting: Obstetrics and Gynecology

## 2015-12-23 ENCOUNTER — Ambulatory Visit (INDEPENDENT_AMBULATORY_CARE_PROVIDER_SITE_OTHER): Payer: Medicaid Other | Admitting: Obstetrics and Gynecology

## 2015-12-23 VITALS — BP 121/74 | HR 108 | Wt 145.8 lb

## 2015-12-23 DIAGNOSIS — A749 Chlamydial infection, unspecified: Secondary | ICD-10-CM

## 2015-12-23 DIAGNOSIS — R634 Abnormal weight loss: Secondary | ICD-10-CM

## 2015-12-23 DIAGNOSIS — F329 Major depressive disorder, single episode, unspecified: Secondary | ICD-10-CM

## 2015-12-23 DIAGNOSIS — F418 Other specified anxiety disorders: Secondary | ICD-10-CM

## 2015-12-23 DIAGNOSIS — O98811 Other maternal infectious and parasitic diseases complicating pregnancy, first trimester: Secondary | ICD-10-CM

## 2015-12-23 DIAGNOSIS — Z124 Encounter for screening for malignant neoplasm of cervix: Secondary | ICD-10-CM

## 2015-12-23 DIAGNOSIS — R110 Nausea: Secondary | ICD-10-CM

## 2015-12-23 DIAGNOSIS — Z3481 Encounter for supervision of other normal pregnancy, first trimester: Secondary | ICD-10-CM

## 2015-12-23 DIAGNOSIS — O98311 Other infections with a predominantly sexual mode of transmission complicating pregnancy, first trimester: Secondary | ICD-10-CM

## 2015-12-23 DIAGNOSIS — O1211 Gestational proteinuria, first trimester: Secondary | ICD-10-CM

## 2015-12-23 DIAGNOSIS — O2341 Unspecified infection of urinary tract in pregnancy, first trimester: Secondary | ICD-10-CM

## 2015-12-23 DIAGNOSIS — O34219 Maternal care for unspecified type scar from previous cesarean delivery: Secondary | ICD-10-CM

## 2015-12-23 DIAGNOSIS — F419 Anxiety disorder, unspecified: Secondary | ICD-10-CM

## 2015-12-23 DIAGNOSIS — Z3491 Encounter for supervision of normal pregnancy, unspecified, first trimester: Secondary | ICD-10-CM

## 2015-12-23 LAB — POCT URINALYSIS DIPSTICK
Bilirubin, UA: NEGATIVE
GLUCOSE UA: NEGATIVE
KETONES UA: NEGATIVE
Nitrite, UA: NEGATIVE
PROTEIN UA: 2
SPEC GRAV UA: 1.025
Urobilinogen, UA: 1
pH, UA: 6.5

## 2015-12-23 NOTE — Progress Notes (Signed)
OBSTETRIC INITIAL PRENATAL VISIT  Subjective:    Heather Cross is being seen today for her first obstetrical visit.  This is not a planned pregnancy. She is a [id number] female at [redacted]w[redacted]d gestation, Estimated Date of Delivery: 07/08/16 with Patient's last menstrual period was 10/02/2015 (approximate). (consistent with 6 week sono). Her obstetrical history is significant for anxiety and depression (with cessation of Zoloft at discovery of pregnancy), gonorrhea infection during pregnancy (recently treated), and UTI in first trimester (NOB labs). Relationship with FOB: significant other, not living together. Patient does intend to breast feed. Pregnancy history fully reviewed.   Obstetric History   G2   P1   T1   P0   A0   TAB0   SAB0   E0   M0   L1     # Outcome Date GA Lbr Len/2nd Weight Sex Delivery Anes PTL Lv  2 Current           1 Term 2013 [redacted]w[redacted]d  8 lb (3.629 kg) M CS-LTranv   Y      Gynecologic History:  Last pap smear was unknown (patient cannot recall).  Results were normal.  Denies h/o abnormal pap smears in the past.  Has a history of STIs: Gonorrhea, treated in 11/2015.    Past Medical History  Diagnosis Date  . Anxiety   . Depression     Family History  Problem Relation Age of Onset  . Cancer Neg Hx   . Diabetes Neg Hx   . Heart disease Maternal Grandmother     heart attack  . Hypertension Mother     Past Surgical History  Procedure Laterality Date  . Cesarean section      Social History   Social History  . Marital Status: Single    Spouse Name: N/A  . Number of Children: N/A  . Years of Education: N/A   Occupational History  . Not on file.   Social History Main Topics  . Smoking status: Former Research scientist (life sciences)  . Smokeless tobacco: Never Used  . Alcohol Use: No  . Drug Use: No  . Sexual Activity:    Partners: Male    Birth Control/ Protection: None   Other Topics Concern  . Not on file   Social History Narrative    Current Outpatient Prescriptions  on File Prior to Visit  Medication Sig Dispense Refill  . Prenat-FeFmCb-DSS-FA-DHA w/o A (CITRANATAL HARMONY) 27-1-260 MG CAPS Take 27 mg by mouth daily. 30 capsule 11  . promethazine (PHENERGAN) 25 MG tablet Take 1 tablet (25 mg total) by mouth every 6 (six) hours as needed for nausea or vomiting. 30 tablet 1  . sertraline (ZOLOFT) 50 MG tablet Take 50 mg by mouth daily. Reported on 12/23/2015     No current facility-administered medications on file prior to visit.     No Known Allergies    Review of Systems General:Present - Weight Loss (5 lbs since last visit).  Not Present- Fever, and Weight Gain. Skin:Not Present- Rash. HEENT:Not Present- Blurred Vision, Headache and Bleeding Gums. Respiratory:Not Present- Difficulty Breathing. Breast:Present - Breast tenderness. Not Present- Breast Mass. Cardiovascular:Not Present- Chest Pain, Elevated Blood Pressure, Fainting / Blacking Out and Shortness of Breath. Gastrointestinal:Present - Nausea. Not Present- Abdominal Pain, Constipation, and Vomiting. Female Genitourinary:Not Present- Frequency, Painful Urination, Pelvic Pain, Vaginal Bleeding, Vaginal Discharge, Contractions, regular, Fetal Movements Decreased, Urinary Complaints and Vaginal Fluid. Musculoskeletal:Not Present- Back Pain and Leg Cramps. Neurological:Not Present- Dizziness. Psychiatric:Not Present- Depression.  Objective:   Blood pressure 121/74, pulse 108, weight 145 lb 12.8 oz (66.134 kg), last menstrual period 10/02/2015.  Body mass index is 24.26 kg/(m^2).  General Appearance:    Alert, cooperative, no distress, appears stated age  Head:    Normocephalic, without obvious abnormality, atraumatic  Eyes:    PERRL, conjunctiva/corneas clear, EOM's intact, both eyes  Ears:    Normal external ear canals, both ears  Nose:   Nares normal, septum midline, mucosa normal, no drainage or sinus tenderness  Throat:   Lips, mucosa, and tongue normal; teeth and gums  normal  Neck:   Supple, symmetrical, trachea midline, no adenopathy; thyroid: no enlargement/tenderness/nodules; no carotid bruit or JVD  Back:     Symmetric, no curvature, ROM normal, no CVA tenderness  Lungs:     Clear to auscultation bilaterally, respirations unlabored  Chest Wall:    No tenderness or deformity   Heart:    Regular rate and rhythm, S1 and S2 normal, no murmur, rub or gallop  Breast Exam:    No tenderness, masses, or nipple abnormality.  Nodularity of bilateral breasts noted.   Abdomen:     Soft, non-tender, bowel sounds active all four quadrants, no masses, no organomegaly.  FH 12 cm.  FHT 165 bpm (by unofficial sono).  Genitalia:    Pelvic:external genitalia normal, vagina without lesions, discharge, or tenderness, rectovaginal septum  normal. Cervix normal in appearance, no cervical motion tenderness, no adnexal masses or tenderness.  Pregnancy positive findings: uterine enlargement: 12 wk size, nontender.   Rectal:    Normal external sphincter.  No hemorrhoids appreciated. Internal exam not done.   Extremities:   Extremities normal, atraumatic, no cyanosis or edema  Pulses:   2+ and symmetric all extremities  Skin:   Skin color, texture, turgor normal, no rashes or lesions  Lymph nodes:   Cervical, supraclavicular, and axillary nodes normal  Neurologic:   CNII-XII intact, normal strength, sensation and reflexes throughout       Assessment:   Pregnancy at 11 and 5/7 weeks   H/o anxiety/depression H/o gonorrhea infection, recently treated UTI in pregnancy, recently treated H/o prior C-section x 1 Proteinuria in pregnancy Nausea in pregnancy   Plan:   Initial labs reviewed. Pap smear performed today.  TOC for gonorrhea infection recently done, negative.  Patient reports partner testing and treatment.  For TOC urine culture for recent UTI.  Prenatal vitamins encouraged. Problem list reviewed and updated. New OB counseling:  The patient has been given an overview  regarding routine prenatal care.  Recommendations regarding diet, weight gain, and exercise in pregnancy were given. Nausea of pregnancy with weight loss (due to strong food aversions). Declines taking medications.  Discussed peppermint, ginger.  Prenatal testing, optional genetic testing, and ultrasound use in pregnancy were reviewed.  AFP3 discussed: undecided. Given handout. Benefits of Breast Feeding were discussed. The patient is encouraged to consider nursing her baby post partum. Prior C-section x 1 performed for fetal distress.  Discussion had on TOLAC vs repeat C-section, patient notes she would desire repeat C-section.  Will readdress again towards the latter portion of pregnancy.  Follow up in 4 weeks.  50% of 30 min visit spent on counseling and coordination of care.    Rubie Maid, MD [facility]

## 2015-12-24 LAB — URINE CULTURE

## 2015-12-29 LAB — PAP IG, CT-NG, RFX HPV ASCU
Chlamydia, Nuc. Acid Amp: NEGATIVE
Gonococcus by Nucleic Acid Amp: NEGATIVE
PAP SMEAR COMMENT: 0

## 2015-12-30 ENCOUNTER — Encounter: Payer: Self-pay | Admitting: Obstetrics and Gynecology

## 2015-12-30 DIAGNOSIS — N870 Mild cervical dysplasia: Secondary | ICD-10-CM | POA: Insufficient documentation

## 2016-01-04 ENCOUNTER — Encounter: Payer: Self-pay | Admitting: Obstetrics and Gynecology

## 2016-01-20 ENCOUNTER — Encounter: Payer: Self-pay | Admitting: Obstetrics and Gynecology

## 2016-01-20 ENCOUNTER — Ambulatory Visit (INDEPENDENT_AMBULATORY_CARE_PROVIDER_SITE_OTHER): Payer: Medicaid Other | Admitting: Obstetrics and Gynecology

## 2016-01-20 VITALS — Wt 143.3 lb

## 2016-01-20 DIAGNOSIS — A749 Chlamydial infection, unspecified: Secondary | ICD-10-CM

## 2016-01-20 DIAGNOSIS — Z3492 Encounter for supervision of normal pregnancy, unspecified, second trimester: Secondary | ICD-10-CM

## 2016-01-20 DIAGNOSIS — O98811 Other maternal infectious and parasitic diseases complicating pregnancy, first trimester: Secondary | ICD-10-CM

## 2016-01-20 DIAGNOSIS — O98311 Other infections with a predominantly sexual mode of transmission complicating pregnancy, first trimester: Secondary | ICD-10-CM

## 2016-01-20 DIAGNOSIS — F329 Major depressive disorder, single episode, unspecified: Secondary | ICD-10-CM

## 2016-01-20 DIAGNOSIS — F419 Anxiety disorder, unspecified: Secondary | ICD-10-CM

## 2016-01-20 DIAGNOSIS — Z3482 Encounter for supervision of other normal pregnancy, second trimester: Secondary | ICD-10-CM

## 2016-01-20 DIAGNOSIS — F418 Other specified anxiety disorders: Secondary | ICD-10-CM

## 2016-01-20 NOTE — Progress Notes (Signed)
ROB: Patient doing well, denies complaints.  For anatomy scan next visit.  RTC in 4 weeks. Patient unable to void today.

## 2016-01-20 NOTE — Progress Notes (Signed)
Pt unable to void, also unable to obtain vitals as pt left.

## 2016-01-25 ENCOUNTER — Telehealth: Payer: Self-pay | Admitting: Obstetrics and Gynecology

## 2016-01-25 NOTE — Telephone Encounter (Signed)
She wants to know if the [facility] people have talked to ya'll.

## 2016-01-27 NOTE — Telephone Encounter (Signed)
Responded to pt via mychart

## 2016-02-17 ENCOUNTER — Ambulatory Visit (INDEPENDENT_AMBULATORY_CARE_PROVIDER_SITE_OTHER): Payer: Medicaid Other

## 2016-02-17 DIAGNOSIS — Z3492 Encounter for supervision of normal pregnancy, unspecified, second trimester: Secondary | ICD-10-CM

## 2016-02-17 DIAGNOSIS — Z3482 Encounter for supervision of other normal pregnancy, second trimester: Secondary | ICD-10-CM | POA: Diagnosis not present

## 2016-02-24 ENCOUNTER — Ambulatory Visit (INDEPENDENT_AMBULATORY_CARE_PROVIDER_SITE_OTHER): Payer: Medicaid Other | Admitting: Obstetrics and Gynecology

## 2016-02-24 VITALS — BP 86/61 | HR 114 | Wt 156.0 lb

## 2016-02-24 DIAGNOSIS — Z3492 Encounter for supervision of normal pregnancy, unspecified, second trimester: Secondary | ICD-10-CM

## 2016-02-24 DIAGNOSIS — Z3482 Encounter for supervision of other normal pregnancy, second trimester: Secondary | ICD-10-CM

## 2016-02-24 DIAGNOSIS — M25559 Pain in unspecified hip: Secondary | ICD-10-CM

## 2016-02-24 DIAGNOSIS — O26892 Other specified pregnancy related conditions, second trimester: Secondary | ICD-10-CM

## 2016-02-24 LAB — POCT URINALYSIS DIPSTICK
BILIRUBIN UA: NEGATIVE
Glucose, UA: NEGATIVE
KETONES UA: NEGATIVE
Nitrite, UA: NEGATIVE
PH UA: 6
PROTEIN UA: NEGATIVE
Urobilinogen, UA: NEGATIVE

## 2016-02-24 NOTE — Progress Notes (Signed)
ROB: C/o bilateral hip pain, noted mostly at night when trying to sleep.  Concerned regarding sleeping on her back and hurting the baby.  Patient given reassurance.  Advised on certain yoga stretches, medicine ball, and Tylenol prn for hip pain.  Can use pregnancy pillow to help lie on sides.  Declines flu vaccine.S/p normal anatomy scan.  RTC in 4 weeks.

## 2016-03-02 ENCOUNTER — Encounter: Payer: Self-pay | Admitting: Obstetrics and Gynecology

## 2016-03-03 ENCOUNTER — Telehealth: Payer: Self-pay | Admitting: Obstetrics and Gynecology

## 2016-03-03 NOTE — Telephone Encounter (Signed)
[redacted] wk pregnant, fell last night, landed on her ankle. She said she is walking on it. Do you think she needs to come in and be seen, or can you call and tell her what to look for.

## 2016-03-03 NOTE — Telephone Encounter (Signed)
Called pt she states that last night she fell down the stairs at her home. Pt states that she landed on her right ankle. Pt denies vaginal bleeding or leaking fluid. Pt denies any bruising but does note some swelling initially but has since resolved. Pt has taken nothing for relief of pain. Pt has good fetal movement. Advised pt on use of ice packs, and tylenol. To call back if no improvement.

## 2016-03-23 ENCOUNTER — Ambulatory Visit (INDEPENDENT_AMBULATORY_CARE_PROVIDER_SITE_OTHER): Payer: Medicaid Other | Admitting: Obstetrics and Gynecology

## 2016-03-23 VITALS — BP 138/77 | HR 108 | Wt 162.9 lb

## 2016-03-23 DIAGNOSIS — Z131 Encounter for screening for diabetes mellitus: Secondary | ICD-10-CM

## 2016-03-23 DIAGNOSIS — O479 False labor, unspecified: Secondary | ICD-10-CM

## 2016-03-23 DIAGNOSIS — Z3482 Encounter for supervision of other normal pregnancy, second trimester: Secondary | ICD-10-CM

## 2016-03-23 LAB — POCT URINALYSIS DIPSTICK
BILIRUBIN UA: NEGATIVE
Glucose, UA: NEGATIVE
Ketones, UA: NEGATIVE
NITRITE UA: NEGATIVE
PH UA: 6.5
PROTEIN UA: NEGATIVE
Spec Grav, UA: 1.02
UROBILINOGEN UA: NEGATIVE

## 2016-03-23 NOTE — Progress Notes (Signed)
ROB: Notes being stressed by FOB (notes he does not want to take his antidepressant meds). Also notes infrequent [address].  RTC in 4 weeks, for 28 week labs at that time.

## 2016-04-20 ENCOUNTER — Other Ambulatory Visit: Payer: Medicaid Other

## 2016-04-20 ENCOUNTER — Ambulatory Visit (INDEPENDENT_AMBULATORY_CARE_PROVIDER_SITE_OTHER): Payer: Medicaid Other | Admitting: Obstetrics and Gynecology

## 2016-04-20 VITALS — BP 118/75 | HR 120 | Wt 165.5 lb

## 2016-04-20 DIAGNOSIS — Z3483 Encounter for supervision of other normal pregnancy, third trimester: Secondary | ICD-10-CM

## 2016-04-20 DIAGNOSIS — Z131 Encounter for screening for diabetes mellitus: Secondary | ICD-10-CM

## 2016-04-20 DIAGNOSIS — Z3482 Encounter for supervision of other normal pregnancy, second trimester: Secondary | ICD-10-CM

## 2016-04-20 MED ORDER — CONCEPT DHA 53.5-38-1 MG PO CAPS: 53.5000 mg | ORAL_CAPSULE | Freq: Every day | ORAL | 11 refills | Status: DC

## 2016-04-20 NOTE — Progress Notes (Signed)
ROB: Notes she had a lump on her back for several days last week, now has resolved. For 28 week labs today.  Desires to breastfeed, unsure of desires for contraception. DeclinesTdap today, signed blood consent, discussed cord blood banking.

## 2016-04-21 ENCOUNTER — Encounter: Payer: Self-pay | Admitting: Obstetrics and Gynecology

## 2016-04-21 LAB — GLUCOSE, 1 HOUR GESTATIONAL: Gestational Diabetes Screen: 117 mg/dL (ref 65–139)

## 2016-04-21 LAB — HEMOGLOBIN AND HEMATOCRIT, BLOOD

## 2016-05-10 ENCOUNTER — Ambulatory Visit (INDEPENDENT_AMBULATORY_CARE_PROVIDER_SITE_OTHER): Payer: Medicaid Other | Admitting: Obstetrics and Gynecology

## 2016-05-10 ENCOUNTER — Encounter: Payer: Self-pay | Admitting: Obstetrics and Gynecology

## 2016-05-10 VITALS — BP 126/66 | HR 109 | Wt 163.1 lb

## 2016-05-10 DIAGNOSIS — Z3483 Encounter for supervision of other normal pregnancy, third trimester: Secondary | ICD-10-CM

## 2016-05-10 DIAGNOSIS — O34219 Maternal care for unspecified type scar from previous cesarean delivery: Secondary | ICD-10-CM

## 2016-05-10 LAB — POCT URINALYSIS DIPSTICK
BILIRUBIN UA: NEGATIVE
GLUCOSE UA: NEGATIVE
Ketones, UA: NEGATIVE
Nitrite, UA: NEGATIVE
Protein, UA: NEGATIVE
SPEC GRAV UA: 1.02
Urobilinogen, UA: 1
pH, UA: 7

## 2016-05-10 NOTE — Progress Notes (Signed)
ROB: Doing well, no complaints. Discussed TOLAC vs repeat C-section again, patient desires repeat C-section.  Will tentatively schedule for 07/05/15. Normal glucola.  RTC in 2 weeks.

## 2016-05-24 ENCOUNTER — Ambulatory Visit (INDEPENDENT_AMBULATORY_CARE_PROVIDER_SITE_OTHER): Payer: Medicaid Other | Admitting: Obstetrics and Gynecology

## 2016-05-24 VITALS — BP 103/70 | HR 114 | Wt 167.1 lb

## 2016-05-24 DIAGNOSIS — Z3483 Encounter for supervision of other normal pregnancy, third trimester: Secondary | ICD-10-CM

## 2016-05-24 DIAGNOSIS — O34219 Maternal care for unspecified type scar from previous cesarean delivery: Secondary | ICD-10-CM

## 2016-05-24 LAB — POCT URINALYSIS DIPSTICK
BILIRUBIN UA: NEGATIVE
GLUCOSE UA: NEGATIVE
KETONES UA: NEGATIVE
Nitrite, UA: NEGATIVE
PH UA: 6.5
Protein, UA: NEGATIVE
Spec Grav, UA: 1.025
Urobilinogen, UA: NEGATIVE

## 2016-05-25 NOTE — Progress Notes (Signed)
ROB: Notes that she is having some stressors at home (is becoming more irritated and agitated by family members), but is otherwise doing ok. Discused scheduling C-section for 06/03/16.  RTC in 2 weeks.

## 2016-05-31 ENCOUNTER — Encounter: Payer: Self-pay | Admitting: Obstetrics and Gynecology

## 2016-06-02 ENCOUNTER — Telehealth: Payer: Self-pay

## 2016-06-02 NOTE — Telephone Encounter (Signed)
Called pt she states that she is continuing to have a pink colored discharge when she wipes. Pt denies pain, leaking fluid, intercourse or contractions. Again advised pt that some bleeding in pregnancy can be normal. Advised pt to monitor bleeding with pad, call back or seek care in the ED if bleeding becomes heavy or if she has pain. Pt gave verbal understanding.

## 2016-06-02 NOTE — Telephone Encounter (Signed)
[redacted] wks pregnant. Heather Cross called and said she still has pink discharge mixed with a little red. She was upset and I told her it was lunch and I would send a msg to the nurse to call her back. She said she has called 2 other times about this.

## 2016-06-06 NOTE — L&D Delivery Note (Signed)
Delivery Summary for Rise Paganini  Labor Events:   Preterm labor:   Rupture date:   Rupture time:   Rupture type: Intact  Fluid Color: Clear  Induction:   Augmentation:   Complications:   Cervical ripening:          Delivery:   Episiotomy:   Lacerations:   Repair suture:   Repair # of packets:   Blood loss (ml): 500   Information for the patient's newborn:  Verlean, Navidad [id number]    Delivery 07/04/2016 11:01 AM by  C-Section, Low Transverse Sex:  female Gestational Age: [redacted]w[redacted]d Delivery Clinician:   Living?:         APGARS  One minute Five minutes Ten minutes  Skin color:        Heart rate:        Grimace:        Muscle tone:        Breathing:        Totals: 9  10      Presentation/position:      Resuscitation:   Cord information:    Disposition of cord blood:     Blood gases sent?  Complications:   Placenta: Delivered:       appearance Newborn Measurements: Weight: 8 lb 1.5 oz (3670 g)  Height: 20"  Head circumference:    Chest circumference:    Other providers:    Additional  information: Forceps:   Vacuum:   Breech:   Observed anomalies        See Dr. Andreas Blower operative note for details of C-section procedure.    Rubie Maid, MD [facility]

## 2016-06-08 ENCOUNTER — Inpatient Hospital Stay
Admission: EM | Admit: 2016-06-08 | Discharge: 2016-06-08 | Disposition: A | Discharge status: Home / Self Care | Payer: Medicaid Other | Attending: Obstetrics and Gynecology | Admitting: Obstetrics and Gynecology

## 2016-06-08 ENCOUNTER — Ambulatory Visit (INDEPENDENT_AMBULATORY_CARE_PROVIDER_SITE_OTHER): Payer: Medicaid Other | Admitting: Obstetrics and Gynecology

## 2016-06-08 VITALS — BP 90/62 | HR 116 | Wt 173.8 lb

## 2016-06-08 DIAGNOSIS — Z3483 Encounter for supervision of other normal pregnancy, third trimester: Secondary | ICD-10-CM

## 2016-06-08 DIAGNOSIS — Z3A36 36 weeks gestation of pregnancy: Secondary | ICD-10-CM | POA: Insufficient documentation

## 2016-06-08 DIAGNOSIS — Z113 Encounter for screening for infections with a predominantly sexual mode of transmission: Secondary | ICD-10-CM

## 2016-06-08 DIAGNOSIS — Z3685 Encounter for antenatal screening for Streptococcus B: Secondary | ICD-10-CM

## 2016-06-08 DIAGNOSIS — N939 Abnormal uterine and vaginal bleeding, unspecified: Secondary | ICD-10-CM

## 2016-06-08 DIAGNOSIS — N920 Excessive and frequent menstruation with regular cycle: Secondary | ICD-10-CM

## 2016-06-08 DIAGNOSIS — O34219 Maternal care for unspecified type scar from previous cesarean delivery: Secondary | ICD-10-CM

## 2016-06-08 LAB — POCT URINALYSIS DIPSTICK
BILIRUBIN UA: NEGATIVE
GLUCOSE UA: NEGATIVE
Ketones, UA: NEGATIVE
Nitrite, UA: NEGATIVE
Protein, UA: NEGATIVE
SPEC GRAV UA: 1.01
Urobilinogen, UA: NEGATIVE
pH, UA: 5

## 2016-06-08 LAB — URINALYSIS, COMPLETE (UACMP) WITH MICROSCOPIC
BILIRUBIN URINE: NEGATIVE
GLUCOSE, UA: NEGATIVE mg/dL
KETONES UR: NEGATIVE mg/dL
NITRITE: NEGATIVE
PH: 6 (ref 5.0–8.0)
Protein, ur: NEGATIVE mg/dL
Specific Gravity, Urine: 1.012 (ref 1.005–1.030)

## 2016-06-08 LAB — OB RESULTS CONSOLE GBS: STREP GROUP B AG: NEGATIVE

## 2016-06-08 NOTE — Addendum Note (Signed)
Addended by: Donalee Citrin on: 06/08/2016 11:43 AM   Modules accepted: Orders

## 2016-06-08 NOTE — Progress Notes (Signed)
ROB: Patient seen in triage this morning for vaginal bleeding. Patient reports it was bright red, heavy, but per picture and per nurse, was scant amount.  Discussed bleeding precautions with patient. 36 week labs done today. No blood in vaginal vault. UA reviewed from triage, will also get urine Cx.  For repeat CBC today for anemia in pregnancy. RTC in 1 week.

## 2016-06-08 NOTE — Progress Notes (Signed)
Pt presents to triage in no acute distress, states she called earlier in the night about bleeding but had to wait for a ride to come to hospital. Pt showing picture of toilet paper with blood and mucus discharge in toilet after urinating around 0245a. States she has had pink tinged spotting about 1.5 wks ago, says she told RN in clinic about spotting and was told it is normal and she should keep "watching it" Recalls urinating frequently over the past couple weeks. Denies pressure or burning and stinging with urination. No intercourse since 2nd trimester. Confirms +FM, and contractions coming 5-10 mins aparts since about 2:30/3a and feeling increased pressure with ambulating. Slightly dizzy but no nausea or vomiting. Says she has been staying hydrated.

## 2016-06-08 NOTE — Discharge Instructions (Signed)
Pelvic rest, may take over the counter Tylenol for discomfort, drink plenty fluids, rest and keep scheduled appt today at 11a.

## 2016-06-08 NOTE — Addendum Note (Signed)
Addended by: Donalee Citrin on: 06/08/2016 02:04 PM   Modules accepted: Orders

## 2016-06-08 NOTE — Progress Notes (Signed)
Discharge instructions reviewed with patient, patient verbalized understanding, copy signed and given.  Family members at the bedside to take patient home.

## 2016-06-08 NOTE — Progress Notes (Deleted)
ROB: Patient   

## 2016-06-08 NOTE — Progress Notes (Signed)
Spoke with Dr Marcelline Mates about pt presenting to triage with c/o bright red vaginal bleeding at home while voiding, pt did not wear pad on way to hospital and since being in triage, no active bleeding on peripad provided. Per MD, send urine for UA and call back with results, advise pt to keep scheduled appt in clinic to be further evaluated tomorrow at 11a, maintain pelvic rest, nothing in vagina, will call in rx for pt since she reports having fishy odor and pt should wear a pad if she has any bleeding and call OB clinic if bleeding is bright red and heavy like a period. Otherwise, keep appt tomorrow for f/u.

## 2016-06-08 NOTE — Progress Notes (Signed)
Pt confirms she does not have any active bleeding or spotting since being in triage, baby is active and feeling pain during contractions in lower abdomen. Pt rates pain 6/10 at present, lying calm in bed, no grimacing or sign of distress noted. Per MD, pt may be discharge home since no active bleeding, UA results pending, f/u in clinic today as scheduled.

## 2016-06-09 LAB — CBC WITH DIFFERENTIAL/PLATELET
BASOS ABS: 0 10*3/uL (ref 0.0–0.2)
Basos: 0 %
EOS (ABSOLUTE): 0.1 10*3/uL (ref 0.0–0.4)
Eos: 1 %
HEMOGLOBIN: 11.8 g/dL (ref 11.1–15.9)
Hematocrit: 36.2 % (ref 34.0–46.6)
Immature Grans (Abs): 0.2 10*3/uL — ABNORMAL HIGH (ref 0.0–0.1)
Immature Granulocytes: 2 %
LYMPHS ABS: 3 10*3/uL (ref 0.7–3.1)
Lymphs: 21 %
MCH: 28.4 pg (ref 26.6–33.0)
MCHC: 32.6 g/dL (ref 31.5–35.7)
MCV: 87 fL (ref 79–97)
MONOCYTES: 6 %
Monocytes Absolute: 0.9 10*3/uL (ref 0.1–0.9)
Neutrophils Absolute: 10.1 10*3/uL — ABNORMAL HIGH (ref 1.4–7.0)
Neutrophils: 70 %
PLATELETS: 306 10*3/uL (ref 150–379)
RBC: 4.15 x10E6/uL (ref 3.77–5.28)
RDW: 13.8 % (ref 12.3–15.4)
WBC: 14.4 10*3/uL — AB (ref 3.4–10.8)

## 2016-06-10 ENCOUNTER — Telehealth: Payer: Self-pay

## 2016-06-10 DIAGNOSIS — B9689 Other specified bacterial agents as the cause of diseases classified elsewhere: Secondary | ICD-10-CM

## 2016-06-10 DIAGNOSIS — N760 Acute vaginitis: Principal | ICD-10-CM

## 2016-06-10 LAB — NUSWAB VAGINITIS PLUS (VG+)
Atopobium vaginae: HIGH Score — AB
BVAB 2: HIGH Score — AB
CHLAMYDIA TRACHOMATIS, NAA: NEGATIVE
Candida albicans, NAA: NEGATIVE
Candida glabrata, NAA: NEGATIVE
MEGASPHAERA 1: HIGH {score} — AB
NEISSERIA GONORRHOEAE, NAA: NEGATIVE
Trich vag by NAA: NEGATIVE

## 2016-06-10 LAB — URINE CULTURE

## 2016-06-10 MED ORDER — METRONIDAZOLE 500 MG PO TABS: 500.0000 mg | ORAL_TABLET | Freq: Two times a day (BID) | ORAL | 0 refills | Status: DC

## 2016-06-10 NOTE — Telephone Encounter (Signed)
-----   Message from Rubie Maid, MD sent at 06/10/2016 10:01 AM EST ----- Please inform patient of BV infection.  Still waiting on results of Trichomonas, but Flagyl treatment with treat both if she does have a trichomoniasis infection.

## 2016-06-10 NOTE — Telephone Encounter (Signed)
Called pt no answer, LM for pt informing her of BV. RX sent in, also sent pt mychart message with pt education.

## 2016-06-11 LAB — CULTURE, BETA STREP (GROUP B ONLY): Strep Gp B Culture: NEGATIVE

## 2016-06-15 ENCOUNTER — Ambulatory Visit (INDEPENDENT_AMBULATORY_CARE_PROVIDER_SITE_OTHER): Payer: Medicaid Other | Admitting: Obstetrics and Gynecology

## 2016-06-15 VITALS — BP 102/67 | HR 105 | Wt 169.1 lb

## 2016-06-15 DIAGNOSIS — R460 Very low level of personal hygiene: Secondary | ICD-10-CM

## 2016-06-15 DIAGNOSIS — O34219 Maternal care for unspecified type scar from previous cesarean delivery: Secondary | ICD-10-CM

## 2016-06-15 DIAGNOSIS — Z3483 Encounter for supervision of other normal pregnancy, third trimester: Secondary | ICD-10-CM

## 2016-06-15 DIAGNOSIS — N760 Acute vaginitis: Secondary | ICD-10-CM

## 2016-06-15 DIAGNOSIS — B9689 Other specified bacterial agents as the cause of diseases classified elsewhere: Secondary | ICD-10-CM

## 2016-06-15 LAB — POCT URINALYSIS DIPSTICK
Bilirubin, UA: NEGATIVE
Blood, UA: NEGATIVE
Glucose, UA: NEGATIVE
Ketones, UA: NEGATIVE
LEUKOCYTES UA: NEGATIVE
NITRITE UA: NEGATIVE
PH UA: 6.5
PROTEIN UA: NEGATIVE
Spec Grav, UA: 1.015
UROBILINOGEN UA: NEGATIVE

## 2016-06-15 NOTE — Progress Notes (Signed)
ROB: Patient c/o [address], increased pelvic pressure. Notes she did not yet get prescription for Flagyl for recent BV infection due to cost, but will pick it up this week.  GBS negative.  Reiterated labor precautions. Declines blood draw today (needs repeat CBC due to h/o anemia).  Advised that she will need to have her blood drawn by next week for pre-op labs for upcoming C-section. Patient ok with this.  RTC in 1 week.  Will have Medicaid social worker follow up on pt again as she is has very disheveled appearance, dirty clothes, and her son mentioned several times of "still being hungry".

## 2016-06-18 NOTE — Progress Notes (Deleted)
ROB

## 2016-06-22 ENCOUNTER — Other Ambulatory Visit: Payer: Self-pay

## 2016-06-23 ENCOUNTER — Encounter: Payer: Medicaid Other | Admitting: Certified Nurse Midwife

## 2016-06-30 ENCOUNTER — Ambulatory Visit (INDEPENDENT_AMBULATORY_CARE_PROVIDER_SITE_OTHER): Payer: Medicaid Other | Admitting: Certified Nurse Midwife

## 2016-06-30 VITALS — BP 102/73 | HR 109 | Wt 172.4 lb

## 2016-06-30 DIAGNOSIS — Z3483 Encounter for supervision of other normal pregnancy, third trimester: Secondary | ICD-10-CM

## 2016-06-30 LAB — POCT URINALYSIS DIPSTICK
Bilirubin, UA: NEGATIVE
Glucose, UA: NEGATIVE
KETONES UA: NEGATIVE
Nitrite, UA: NEGATIVE
PH UA: 6.5
PROTEIN UA: NEGATIVE
SPEC GRAV UA: 1.015
UROBILINOGEN UA: NEGATIVE

## 2016-06-30 NOTE — Progress Notes (Signed)
ROB-No complaints. Declines SVE. Reviewed labor precautions and red flag symptoms, verbalized understanding. Reminder pre-admit 07/01/2016 @ 11:15 given on sticky note. Medicaid social worker meeting with pt after visit per unkempt appearance and her hungry child.

## 2016-07-01 ENCOUNTER — Other Ambulatory Visit: Payer: Self-pay | Admitting: *Deleted

## 2016-07-01 ENCOUNTER — Encounter
Admission: RE | Admit: 2016-07-01 | Discharge: 2016-07-01 | Disposition: A | Discharge status: Home / Self Care | Payer: Medicaid Other | Source: Ambulatory Visit | Attending: Obstetrics and Gynecology | Admitting: Obstetrics and Gynecology

## 2016-07-01 DIAGNOSIS — Z01812 Encounter for preprocedural laboratory examination: Secondary | ICD-10-CM | POA: Diagnosis not present

## 2016-07-01 LAB — CBC
HEMATOCRIT: 37.5 % (ref 35.0–47.0)
HEMOGLOBIN: 12.9 g/dL (ref 12.0–16.0)
MCH: 29.6 pg (ref 26.0–34.0)
MCHC: 34.5 g/dL (ref 32.0–36.0)
MCV: 85.7 fL (ref 80.0–100.0)
Platelets: 248 10*3/uL (ref 150–440)
RBC: 4.37 MIL/uL (ref 3.80–5.20)
RDW: 14.4 % (ref 11.5–14.5)
WBC: 11.4 10*3/uL — ABNORMAL HIGH (ref 3.6–11.0)

## 2016-07-01 LAB — TYPE AND SCREEN
ABO/RH(D): A POS
Antibody Screen: NEGATIVE
Extend sample reason: UNDETERMINED

## 2016-07-01 NOTE — Patient Instructions (Signed)
Your procedure is scheduled on: Monday 07/04/16 Report to THE [facility] AT 8:00 AM ON THE 3RD FLOOR THRU THE VISITOR ENTRANCE .  Remember: Instructions that are not followed completely may result in serious medical risk, up to and including death, or upon the discretion of your surgeon and anesthesiologist your surgery may need to be rescheduled.    __X__ 1. Do not eat food or drink liquids after midnight. No gum chewing or hard candies.     __X__ 2. No Alcohol for 24 hours before or after surgery.   ____ 3. Bring all medications with you on the day of surgery if instructed.    __X__ 4. Notify your doctor if there is any change in your medical condition     (cold, fever, infections).             ___X__5. No smoking within 24 hours of your surgery.     Do not wear jewelry, make-up, hairpins, clips or nail polish.  Do not wear lotions, powders, or perfumes.   Do not shave 48 hours prior to surgery. Men may shave face and neck.  Do not bring valuables to the hospital.    [facility] is not responsible for any belongings or valuables.               Contacts, dentures or bridgework may not be worn into surgery.  Leave your suitcase in the car. After surgery it may be brought to your room.  For patients admitted to the hospital, discharge time is determined by your                treatment team.   Patients discharged the day of surgery will not be allowed to drive home.   Please read over the following fact sheets that you were given:   Pain Booklet and MRSA Information   ____ Take these medicines the morning of surgery with A SIP OF WATER:    1. NONE  2.   3.   4.  5.  6.  ____ Fleet Enema (as directed)   __X__ Use CHG Soap as directed SAGE WIPES AS INSTRUCTED AVOIDING BREAST AREA  ____ Use inhalers on the day of surgery  ____ Stop metformin 2 days prior to surgery    ____ Take 1/2 of usual insulin dose the night before surgery and none on the morning of surgery.   ____  Stop Coumadin/Plavix/aspirin on   __X__ Stop Anti-inflammatories such as Advil, Aleve, Ibuprofen, Motrin, Naproxen, Naprosyn, Goodies,powder, or aspirin products.  OK to take Tylenol.   ____ Stop supplements until after surgery.    ____ Bring C-Pap to the hospital.

## 2016-07-02 LAB — RPR: RPR Ser Ql: NONREACTIVE

## 2016-07-03 MED ORDER — CEFAZOLIN SODIUM-DEXTROSE 2-4 GM/100ML-% IV SOLN
2.0000 g | INTRAVENOUS | Status: DC
  Filled 2016-07-03 (×2): qty 100

## 2016-07-04 ENCOUNTER — Inpatient Hospital Stay: Payer: Medicaid Other | Admitting: Anesthesiology

## 2016-07-04 ENCOUNTER — Encounter: Payer: Self-pay | Admitting: Anesthesiology

## 2016-07-04 ENCOUNTER — Inpatient Hospital Stay
Admission: RE | Admit: 2016-07-04 | Discharge: 2016-07-06 | DRG: 766 | Disposition: A | Discharge status: Home / Self Care | Payer: Medicaid Other | Source: Ambulatory Visit | Attending: Obstetrics and Gynecology | Admitting: Obstetrics and Gynecology

## 2016-07-04 ENCOUNTER — Encounter
Admission: RE | Disposition: A | Discharge status: Home / Self Care | Payer: Self-pay | Source: Ambulatory Visit | Attending: Obstetrics and Gynecology

## 2016-07-04 DIAGNOSIS — O34219 Maternal care for unspecified type scar from previous cesarean delivery: Secondary | ICD-10-CM | POA: Diagnosis present

## 2016-07-04 DIAGNOSIS — Z3A39 39 weeks gestation of pregnancy: Secondary | ICD-10-CM

## 2016-07-04 DIAGNOSIS — Z8249 Family history of ischemic heart disease and other diseases of the circulatory system: Secondary | ICD-10-CM | POA: Diagnosis not present

## 2016-07-04 DIAGNOSIS — F419 Anxiety disorder, unspecified: Secondary | ICD-10-CM | POA: Diagnosis present

## 2016-07-04 DIAGNOSIS — F329 Major depressive disorder, single episode, unspecified: Secondary | ICD-10-CM | POA: Diagnosis present

## 2016-07-04 DIAGNOSIS — O34211 Maternal care for low transverse scar from previous cesarean delivery: Principal | ICD-10-CM | POA: Diagnosis present

## 2016-07-04 DIAGNOSIS — Z87891 Personal history of nicotine dependence: Secondary | ICD-10-CM

## 2016-07-04 DIAGNOSIS — Z98891 History of uterine scar from previous surgery: Secondary | ICD-10-CM

## 2016-07-04 DIAGNOSIS — Z3483 Encounter for supervision of other normal pregnancy, third trimester: Secondary | ICD-10-CM | POA: Diagnosis not present

## 2016-07-04 LAB — ABO/RH: ABO/RH(D): A POS

## 2016-07-04 SURGERY — Surgical Case
Anesthesia: Spinal | Wound class: Clean Contaminated

## 2016-07-04 MED ORDER — OXYTOCIN 40 UNITS IN LACTATED RINGERS INFUSION - SIMPLE MED: 2.5000 [IU]/h | INTRAVENOUS | Status: AC

## 2016-07-04 MED ORDER — BUPIVACAINE IN DEXTROSE 0.75-8.25 % IT SOLN
INTRATHECAL | Status: DC | PRN
  Administered 2016-07-04: 1.6 mL via INTRATHECAL

## 2016-07-04 MED ORDER — MORPHINE SULFATE (PF) 0.5 MG/ML IJ SOLN
INTRAMUSCULAR | Status: DC | PRN
  Administered 2016-07-04: .1 mg via INTRATHECAL

## 2016-07-04 MED ORDER — OXYCODONE-ACETAMINOPHEN 5-325 MG PO TABS: 1.0000 | ORAL_TABLET | ORAL | Status: DC | PRN

## 2016-07-04 MED ORDER — MORPHINE SULFATE (PF) 0.5 MG/ML IJ SOLN
INTRAMUSCULAR | Status: AC
  Filled 2016-07-04: qty 10

## 2016-07-04 MED ORDER — OXYTOCIN 40 UNITS IN LACTATED RINGERS INFUSION - SIMPLE MED
INTRAVENOUS | Status: DC | PRN
  Administered 2016-07-04: 1000 mL via INTRAVENOUS

## 2016-07-04 MED ORDER — LACTATED RINGERS IV SOLN
INTRAVENOUS | Status: DC
  Administered 2016-07-04 – 2016-07-05 (×2): via INTRAVENOUS

## 2016-07-04 MED ORDER — FENTANYL CITRATE (PF) 100 MCG/2ML IJ SOLN
INTRAMUSCULAR | Status: DC | PRN
  Administered 2016-07-04: 20 ug via INTRATHECAL

## 2016-07-04 MED ORDER — FERROUS SULFATE 325 (65 FE) MG PO TABS
325.0000 mg | ORAL_TABLET | Freq: Two times a day (BID) | ORAL | Status: DC
  Administered 2016-07-05 – 2016-07-06 (×3): 325 mg via ORAL
  Filled 2016-07-04 (×4): qty 1

## 2016-07-04 MED ORDER — LACTATED RINGERS IV SOLN
Freq: Once | INTRAVENOUS | Status: AC
  Administered 2016-07-04: 09:00:00 via INTRAVENOUS

## 2016-07-04 MED ORDER — WITCH HAZEL-GLYCERIN EX PADS: 1.0000 "application " | MEDICATED_PAD | CUTANEOUS | Status: DC | PRN

## 2016-07-04 MED ORDER — SOD CITRATE-CITRIC ACID 500-334 MG/5ML PO SOLN
30.0000 mL | Freq: Once | ORAL | Status: AC
  Administered 2016-07-04: 30 mL via ORAL
  Filled 2016-07-04: qty 30

## 2016-07-04 MED ORDER — FENTANYL CITRATE (PF) 100 MCG/2ML IJ SOLN
INTRAMUSCULAR | Status: AC
  Filled 2016-07-04: qty 2

## 2016-07-04 MED ORDER — ONDANSETRON HCL 4 MG/2ML IJ SOLN: 4.0000 mg | Freq: Once | INTRAMUSCULAR | Status: DC | PRN

## 2016-07-04 MED ORDER — MIDAZOLAM HCL 2 MG/2ML IJ SOLN
INTRAMUSCULAR | Status: AC
  Filled 2016-07-04: qty 2

## 2016-07-04 MED ORDER — PROPOFOL 10 MG/ML IV BOLUS
INTRAVENOUS | Status: AC
  Filled 2016-07-04: qty 20

## 2016-07-04 MED ORDER — COCONUT OIL OIL: 1.0000 "application " | TOPICAL_OIL | Status: DC | PRN

## 2016-07-04 MED ORDER — OXYCODONE-ACETAMINOPHEN 5-325 MG PO TABS
2.0000 | ORAL_TABLET | ORAL | Status: DC | PRN
Start: 2016-07-04 — End: 2016-07-06

## 2016-07-04 MED ORDER — PHENYLEPHRINE HCL 10 MG/ML IJ SOLN
INTRAMUSCULAR | Status: AC
  Filled 2016-07-04: qty 1

## 2016-07-04 MED ORDER — ZOLPIDEM TARTRATE 5 MG PO TABS: 5.0000 mg | ORAL_TABLET | Freq: Every evening | ORAL | Status: DC | PRN

## 2016-07-04 MED ORDER — DIBUCAINE 1 % RE OINT: 1.0000 "application " | TOPICAL_OINTMENT | RECTAL | Status: DC | PRN

## 2016-07-04 MED ORDER — LIDOCAINE 5 % EX PTCH
1.0000 | MEDICATED_PATCH | CUTANEOUS | Status: DC
  Filled 2016-07-04: qty 1

## 2016-07-04 MED ORDER — LIDOCAINE 5 % EX PTCH
1.0000 | MEDICATED_PATCH | CUTANEOUS | Status: DC
  Administered 2016-07-05 – 2016-07-06 (×2): 1 via TRANSDERMAL
  Filled 2016-07-04 (×2): qty 1

## 2016-07-04 MED ORDER — SIMETHICONE 80 MG PO CHEW
80.0000 mg | CHEWABLE_TABLET | ORAL | Status: DC | PRN
  Filled 2016-07-04: qty 1

## 2016-07-04 MED ORDER — DIPHENHYDRAMINE HCL 25 MG PO CAPS: 25.0000 mg | ORAL_CAPSULE | Freq: Four times a day (QID) | ORAL | Status: DC | PRN

## 2016-07-04 MED ORDER — MAGNESIUM HYDROXIDE 400 MG/5ML PO SUSP: 30.0000 mL | ORAL | Status: DC | PRN

## 2016-07-04 MED ORDER — IBUPROFEN 600 MG PO TABS
600.0000 mg | ORAL_TABLET | Freq: Four times a day (QID) | ORAL | Status: DC
  Administered 2016-07-04 – 2016-07-05 (×3): 600 mg via ORAL
  Filled 2016-07-04 (×3): qty 1

## 2016-07-04 MED ORDER — LIDOCAINE 5 % EX PTCH
MEDICATED_PATCH | CUTANEOUS | Status: DC | PRN
  Administered 2016-07-04: 1 via TRANSDERMAL

## 2016-07-04 MED ORDER — CEFAZOLIN SODIUM-DEXTROSE 2-4 GM/100ML-% IV SOLN
2.0000 g | Freq: Once | INTRAVENOUS | Status: DC
  Filled 2016-07-04: qty 100

## 2016-07-04 MED ORDER — LACTATED RINGERS IV SOLN
INTRAVENOUS | Status: DC
  Administered 2016-07-04 (×2): via INTRAVENOUS

## 2016-07-04 MED ORDER — PRENATAL MULTIVITAMIN CH
1.0000 | ORAL_TABLET | Freq: Every day | ORAL | Status: DC
Start: 2016-07-04 — End: 2016-07-06
  Administered 2016-07-05 – 2016-07-06 (×2): 1 via ORAL
  Filled 2016-07-04 (×3): qty 1

## 2016-07-04 MED ORDER — FENTANYL CITRATE (PF) 100 MCG/2ML IJ SOLN: 25.0000 ug | INTRAMUSCULAR | Status: DC | PRN

## 2016-07-04 MED ORDER — SENNOSIDES-DOCUSATE SODIUM 8.6-50 MG PO TABS
2.0000 | ORAL_TABLET | ORAL | Status: DC
  Administered 2016-07-05 (×2): 2 via ORAL
  Filled 2016-07-04 (×2): qty 2

## 2016-07-04 MED ORDER — PHENYLEPHRINE HCL 10 MG/ML IJ SOLN
INTRAMUSCULAR | Status: DC | PRN
  Administered 2016-07-04 (×3): 100 ug via INTRAVENOUS

## 2016-07-04 MED ORDER — MENTHOL 3 MG MT LOZG
1.0000 | LOZENGE | OROMUCOSAL | Status: DC | PRN
  Filled 2016-07-04: qty 9

## 2016-07-04 MED ORDER — TETANUS-DIPHTH-ACELL PERTUSSIS 5-2.5-18.5 LF-MCG/0.5 IM SUSP: 0.5000 mL | Freq: Once | INTRAMUSCULAR | Status: DC

## 2016-07-04 MED ORDER — SIMETHICONE 80 MG PO CHEW
80.0000 mg | CHEWABLE_TABLET | ORAL | Status: DC
  Administered 2016-07-05: 80 mg via ORAL

## 2016-07-04 MED ORDER — ACETAMINOPHEN 325 MG PO TABS: 650.0000 mg | ORAL_TABLET | ORAL | Status: DC | PRN

## 2016-07-04 MED ORDER — CEFAZOLIN SODIUM-DEXTROSE 2-3 GM-% IV SOLR
2.0000 g | Freq: Once | INTRAVENOUS | Status: AC
  Administered 2016-07-04: 2 g via INTRAVENOUS
  Filled 2016-07-04: qty 50

## 2016-07-04 MED ORDER — OXYTOCIN 40 UNITS IN LACTATED RINGERS INFUSION - SIMPLE MED
INTRAVENOUS | Status: AC
  Filled 2016-07-04: qty 1000

## 2016-07-04 SURGICAL SUPPLY — 23 items
BAG COUNTER SPONGE EZ (MISCELLANEOUS) ×2 IMPLANT
CANISTER SUCT 3000ML (MISCELLANEOUS) ×3 IMPLANT
CHLORAPREP W/TINT 26ML (MISCELLANEOUS) ×6 IMPLANT
COUNTER SPONGE BAG EZ (MISCELLANEOUS) ×1
DRSG TELFA 3X8 NADH (GAUZE/BANDAGES/DRESSINGS) ×3 IMPLANT
ELECT REM PT RETURN 9FT ADLT (ELECTROSURGICAL) ×3
ELECTRODE REM PT RTRN 9FT ADLT (ELECTROSURGICAL) ×1 IMPLANT
GAUZE SPONGE 4X4 12PLY STRL (GAUZE/BANDAGES/DRESSINGS) ×3 IMPLANT
GLOVE BIO SURGEON STRL SZ 6.5 (GLOVE) ×6 IMPLANT
GLOVE BIO SURGEONS STRL SZ 6.5 (GLOVE) ×3
GLOVE INDICATOR 7.0 STRL GRN (GLOVE) ×3 IMPLANT
GOWN STRL REUS W/ TWL LRG LVL3 (GOWN DISPOSABLE) ×2 IMPLANT
GOWN STRL REUS W/TWL LRG LVL3 (GOWN DISPOSABLE) ×4
KIT RM TURNOVER STRD PROC AR (KITS) ×3 IMPLANT
NS IRRIG 1000ML POUR BTL (IV SOLUTION) ×3 IMPLANT
PACK C SECTION AR (MISCELLANEOUS) ×3 IMPLANT
PAD OB MATERNITY 4.3X12.25 (PERSONAL CARE ITEMS) ×3 IMPLANT
PAD PREP 24X41 OB/GYN DISP (PERSONAL CARE ITEMS) ×3 IMPLANT
SUT MNCRL AB 4-0 PS2 18 (SUTURE) IMPLANT
SUT PLAIN 2 0 XLH (SUTURE) IMPLANT
SUT VIC AB 0 CT1 36 (SUTURE) ×12 IMPLANT
SUT VIC AB 3-0 SH 27 (SUTURE)
SUT VIC AB 3-0 SH 27X BRD (SUTURE) IMPLANT

## 2016-07-04 NOTE — Op Note (Signed)
Cesarean Section Procedure Note  Indications: patient declines vag del attempt and previous uterine incision (low transverse) x 1  Pre-operative Diagnosis: 39 week 3 day pregnancy, h/o prior C-section x 1.  Post-operative Diagnosis: same  Surgeon: Rubie Maid, MD  Assistants: Malachi Paradise, MD  Procedure: Repeat low transverse Cesarean Section  Anesthesia: Spinal anesthesia  Findings: Female infant, cephalic presentation, [id number] grams, with Apgar scores of 9 at one minute and 10 at five minutes. Intact placenta with 3 vessel cord.  Extremely thin lower uterine segment The uterine outline, tubes and ovaries appeared normal, with the exception of a small, 5 mm fundal fibroid.   Procedure Details: The patient was seen in the Holding Room. The risks, benefits, complications, treatment options, and expected outcomes were discussed with the patient.  The patient concurred with the proposed plan, giving informed consent.  The site of surgery properly noted/marked. The patient was taken to the Operating Room, identified as Heather Cross and the procedure verified as C-Section Delivery.   After induction of anesthesia, the patient was draped and prepped in the usual sterile manner. Anesthesia was tested and noted to be adequate. A Time Out was held and the above information confirmed.  A Pfannenstiel incision was made and carried down through the subcutaneous tissue to the fascia. Fascial incision was made and extended transversely. The fascia was separated from the underlying rectus tissue superiorly and inferiorly. The peritoneum was identified and entered. Peritoneal incision was extended longitudinally. There were a few filmy adhesions of the peritoneum to the anterior surface of the uterus, these were lysed with the bovie. The utero-vesical peritoneal reflection was incised transversely and the bladder flap was bluntly freed from the lower uterine segment. A low transverse uterine incision  was made. Delivered from cephalic presentation was a 3670 gram Female with Apgar scores of 9 at one minute and 10 at five minutes.After the umbilical cord was clamped and cut cord blood was obtained for evaluation. The placenta was removed intact and appeared normal. The uterus was exteriorized and cleared of all clots and debris. The uterine outline, tubes and ovaries appeared normal.  The uterine incision was closed with running locked sutures of 0-Vicryl.  A second suture of 0-Vicryl was used in an imbricating layer.  Hemostasis was observed. Lavage was carried out until clear. The fascia was then reapproximated with a running suture of 0-Vicryl. The skin was reapproximated with 4-0 Monocryl.  Instrument, sponge, and needle counts were correct prior the abdominal closure and at the conclusion of the case.    Estimated Blood Loss:  500 ml      Drains: foley catheter to gravity drainage, 150 ml of clear urine at end of the procedure.  Total IV Fluids:  1650 ml  Specimens: None         Implants: None         Complications:  None; patient tolerated the procedure well.         Disposition: PACU - hemodynamically stable.         Condition: stable   Rubie Maid, MD [facility]

## 2016-07-04 NOTE — H&P (Signed)
Obstetric Preoperative History and Physical  Heather Cross is a 31 y.o. G2P1001 with IUP at [redacted]w[redacted]d presenting for presenting for scheduled repeat cesarean section, declined TOLAC.  No acute concerns.   Prenatal Course Source of Care: [facility] with onset of care at 10 weeks Pregnancy complications or risks: Patient Active Problem List   Diagnosis Date Noted  . H/O cesarean section complicating pregnancy [id number]  . Low grade squamous intraepithelial lesion (LGSIL) on cervical Pap smear 12/30/2015  . Chlamydia infection affecting pregnancy in first trimester, antepartum 12/23/2015  . Anxiety and depression 12/23/2015  . UTI (urinary tract infection) in pregnancy in first trimester 11/20/2015   She plans to breastfeed She desires unsure method for postpartum contraception.   Prenatal labs and studies: ABO, Rh: --/--/A POS (01/26 1147) Antibody: NEG (01/26 1147) Rubella: 4.07 (07/07 1537) RPR: Non Reactive (01/26 1147)  HBsAg: Negative (07/07 1537)  HIV: Non Reactive (07/07 1537)  [id number] (01/03 1229) 1 hr Glucola  normal Genetic screening declined Anatomy US normal   Past Medical History:  Diagnosis Date  . Anxiety   . Depression   . H/O gonorrhea 09/2015   treated 11/2015    Past Surgical History:  Procedure Laterality Date  . CESAREAN SECTION    . EAR FOREIGN BODY REMOVAL     9 rocks removed from ear canal. surgery x3    OB History  Gravida Para Term Preterm AB Living  2 1 1     1   SAB TAB Ectopic Multiple Live Births          1    # Outcome Date GA Lbr Len/2nd Weight Sex Delivery Anes PTL Lv  2 Current           1 Term 2013 [redacted]w[redacted]d  8 lb (3.629 kg) M CS-LTranv   LIV      Social History   Social History  . Marital status: Single    Spouse name: N/A  . Number of children: N/A  . Years of education: N/A   Occupational History  . stay at home    Social History Main Topics  . Smoking status: Former Smoker    Years: 1.00    Types:  Cigarettes  . Smokeless tobacco: Never Used     Comment: 1-2 cig/day  . Alcohol use No  . Drug use: No  . Sexual activity: Yes    Partners: Male    Birth control/ protection: None   Other Topics Concern  . Not on file   Social History Narrative  . No narrative on file    Family History  Problem Relation Age of Onset  . Hypertension Mother   . Glaucoma Father   . Heart disease Maternal Grandmother     heart attack  . Cancer Neg Hx   . Diabetes Neg Hx     Prescriptions Prior to Admission  Medication Sig Dispense Refill Last Dose  . Prenat-FeFum-FePo-FA-Omega 3 (CONCEPT DHA) 53.5-38-1 MG CAPS Take 53.5 mg by mouth daily. 30 capsule 11 Taking    No Known Allergies  Review of Systems: Negative except for what is mentioned in HPI.  Physical Exam: LMP 10/02/2015 (Approximate)  FHR by Doppler: 140 bpm GENERAL: Well-developed, well-nourished female in no acute distress.  LUNGS: Clear to auscultation bilaterally.  HEART: Regular rate and rhythm. ABDOMEN: Soft, nontender, nondistended, gravid, well-healed Pfannenstiel incision. PELVIC: Deferred EXTREMITIES: Nontender, no edema, 2+ distal pulses.   Pertinent Labs/Studies:   Results for orders placed or performed  during the hospital encounter of 07/01/16 (from the past 72 hour(s))  CBC     Status: Abnormal   Collection Time: 07/01/16 11:47 AM  Result Value Ref Range   WBC 11.4 (H) 3.6 - 11.0 K/uL   RBC 4.37 3.80 - 5.20 MIL/uL   Hemoglobin 12.9 12.0 - 16.0 g/dL   HCT 37.5 35.0 - 47.0 %   MCV 85.7 80.0 - 100.0 fL   MCH 29.6 26.0 - 34.0 pg   MCHC 34.5 32.0 - 36.0 g/dL   RDW 14.4 11.5 - 14.5 %   Platelets 248 150 - 440 K/uL  RPR     Status: None   Collection Time: 07/01/16 11:47 AM  Result Value Ref Range   RPR Ser Ql Non Reactive Non Reactive    Comment: (NOTE) Performed At: [facility] [address] [place], [place] [id number] Lindon Romp MD [id number]   Type and screen     Status: None    Collection Time: 07/01/16 11:47 AM  Result Value Ref Range   ABO/RH(D) A POS    Antibody Screen NEG    Sample Expiration 07/04/2016    Extend sample reason PREGNANT WITHIN 3 MONTHS, UNABLE TO EXTEND     Assessment and Plan :Heather Cross is a 31 y.o. G2P1001 at [redacted]w[redacted]d being admitted  for scheduled cesarean section delivery . The patient is understanding of the planned procedure and is aware of and accepting of all surgical risks, including but not limited to: bleeding which may require transfusion or reoperation; infection which may require antibiotics; injury to bowel, bladder, ureters or other surrounding organs which may require repair; injury to the fetus; need for additional procedures including hysterectomy in the event of life-threatening complications; placental abnormalities wth subsequent pregnancies; incisional problems; blood clot disorders which may require blood thinners;, and other postoperative/anesthesia complications. The patient is in agreement with the proposed plan, and gives informed written consent for the procedure. All questions have been answered.  Rubie Maid, MD [facility]

## 2016-07-04 NOTE — Anesthesia Procedure Notes (Signed)
Spinal  Patient location during procedure: OR Start time: 07/04/2016 10:38 AM End time: 07/04/2016 10:43 AM Staffing Anesthesiologist: Martha Clan Performed: anesthesiologist  Preanesthetic Checklist Completed: patient identified, site marked, surgical consent, pre-op evaluation, timeout performed, IV checked, risks and benefits discussed and monitors and equipment checked Spinal Block Patient position: sitting Prep: ChloraPrep Patient monitoring: heart rate, continuous pulse ox, blood pressure and cardiac monitor Approach: midline Location: L4-5 Injection technique: single-shot Needle Needle type: Whitacre and Introducer  Needle gauge: 24 G Needle length: 9 cm Assessment Sensory level: T10 Additional Notes Negative paresthesia. Negative blood return. Positive free-flowing CSF. Expiration date of kit checked and confirmed. Patient tolerated procedure well, without complications.

## 2016-07-04 NOTE — Transfer of Care (Signed)
Immediate Anesthesia Transfer of Care Note  Patient: Heather Cross  Procedure(s) Performed: Procedure(s): CESAREAN SECTION/Boy, 8lbs, 1 oz (N/A)  Patient Location: PACU  Anesthesia Type:Spinal  Level of Consciousness: awake  Airway & Oxygen Therapy: Patient Spontanous Breathing and Patient connected to nasal cannula oxygen  Post-op Assessment: Report given to RN and Post -op Vital signs reviewed and stable  Post vital signs: Reviewed and stable  Last Vitals:  Vitals:   07/04/16 0810 07/04/16 1146  BP: 109/68 91/63  Pulse: (!) 101 (!) 106  Resp: 18 16  Temp: 37.1 C 36.4 C    Last Pain:  Vitals:   07/04/16 1146  TempSrc: Oral  PainSc:          Complications: No apparent anesthesia complications

## 2016-07-04 NOTE — Anesthesia Procedure Notes (Signed)
Date/Time: 07/04/2016 10:21 AM Performed by: Allean Found Pre-anesthesia Checklist: Patient identified, Emergency Drugs available, Suction available, Patient being monitored and Timeout performed Patient Re-evaluated:Patient Re-evaluated prior to inductionOxygen Delivery Method: Nasal cannula

## 2016-07-04 NOTE — Anesthesia Post-op Follow-up Note (Cosign Needed)
Anesthesia QCDR form completed.        

## 2016-07-04 NOTE — Anesthesia Procedure Notes (Signed)
Performed by: Payzlee Ryder       

## 2016-07-04 NOTE — Anesthesia Preprocedure Evaluation (Signed)
Anesthesia Evaluation  Patient identified by MRN, date of birth, ID band Patient awake    Reviewed: Allergy & Precautions, NPO status , Patient's Chart, lab work & pertinent test results  Airway Mallampati: II  TM Distance: >3 FB     Dental no notable dental hx.    Pulmonary former smoker,    Pulmonary exam normal        Cardiovascular negative cardio ROS Normal cardiovascular exam     Neuro/Psych Anxiety Depression negative neurological ROS     GI/Hepatic negative GI ROS, Neg liver ROS,   Endo/Other  negative endocrine ROS  Renal/GU negative Renal ROS  negative genitourinary   Musculoskeletal negative musculoskeletal ROS (+)   Abdominal Normal abdominal exam  (+)   Peds negative pediatric ROS (+)  Hematology negative hematology ROS (+)   Anesthesia Other Findings   Reproductive/Obstetrics (+) Pregnancy                             Anesthesia Physical Anesthesia Plan  ASA: II  Anesthesia Plan: Spinal   Post-op Pain Management:    Induction: Intravenous  Airway Management Planned: Nasal Cannula  Additional Equipment:   Intra-op Plan:   Post-operative Plan:   Informed Consent: I have reviewed the patients History and Physical, chart, labs and discussed the procedure including the risks, benefits and alternatives for the proposed anesthesia with the patient or authorized representative who has indicated his/her understanding and acceptance.   Dental advisory given  Plan Discussed with: CRNA and Surgeon  Anesthesia Plan Comments:         Anesthesia Quick Evaluation

## 2016-07-05 LAB — CBC
HCT: 35.4 % (ref 35.0–47.0)
Hemoglobin: 12.5 g/dL (ref 12.0–16.0)
MCH: 30.6 pg (ref 26.0–34.0)
MCHC: 35.5 g/dL (ref 32.0–36.0)
MCV: 86.2 fL (ref 80.0–100.0)
PLATELETS: 210 10*3/uL (ref 150–440)
RBC: 4.1 MIL/uL (ref 3.80–5.20)
RDW: 14.6 % — ABNORMAL HIGH (ref 11.5–14.5)
WBC: 11.1 10*3/uL — ABNORMAL HIGH (ref 3.6–11.0)

## 2016-07-05 MED ORDER — IBUPROFEN 600 MG PO TABS
600.0000 mg | ORAL_TABLET | Freq: Four times a day (QID) | ORAL | Status: DC
  Administered 2016-07-05 – 2016-07-06 (×4): 600 mg via ORAL
  Filled 2016-07-05 (×4): qty 1

## 2016-07-05 NOTE — Anesthesia Postprocedure Evaluation (Signed)
Anesthesia Post Note  Patient: Heather Cross  Procedure(s) Performed: Procedure(s) (LRB): CESAREAN SECTION/Boy, 8lbs, 1 oz (N/A)  Patient location during evaluation: [facility] Anesthesia Type: Spinal Level of consciousness: oriented, awake and alert and patient cooperative Pain management: pain level controlled Vital Signs Assessment: post-procedure vital signs reviewed and stable Respiratory status: spontaneous breathing and respiratory function stable Cardiovascular status: stable Postop Assessment: no headache Anesthetic complications: no     Last Vitals:  Vitals:   07/04/16 2322 07/05/16 0300  BP: 107/72 (!) 97/54  Pulse: (!) 101 94  Resp: 18 19  Temp: 36.9 C 36.6 C    Last Pain:  Vitals:   07/05/16 0400  TempSrc:   PainSc: Haze Rushing A

## 2016-07-05 NOTE — Lactation Note (Signed)
This note was copied from a baby's chart. Lactation Consultation Note  Patient Name: Heather Cross [id number] Date: 07/05/2016 Reason for consult: Follow-up assessment Mom c/o sleepy baby. PLaced him skin to skin and cleaned diaper area. He woke and latched with LC assist in cradle hold to left breast. I educated parents about looking for/ [place] to have feeding cues at least every 3 hours; trying skin to skin, diaper check, hand expression, stim of baby, etc and calling for help prn. They have numerous pacifiers in room. I cautioned them about using them instead of feeds to have bes tBF experience, milk volume and weight gain for [place]. May use paci prn in unique situations or after he BF well, gains weight and no nursing problems in a few weeks.    Maternal Data    Feeding Feeding Type: Breast Fed  LATCH Score/Interventions Latch: Grasps breast easily, tongue down, lips flanged, rhythmical sucking.  Audible Swallowing: A few with stimulation Intervention(s): Hand expression  Type of Nipple: Everted at rest and after stimulation  Comfort (Breast/Nipple): Soft / non-tender     Hold (Positioning): Assistance needed to correctly position infant at breast and maintain latch. Intervention(s): Support Pillows  LATCH Score: 8  Lactation Tools Discussed/Used     Consult Status      Roque Cash 07/05/2016, 10:04 AM

## 2016-07-05 NOTE — Progress Notes (Signed)
Postpartum Day # 1: Cesarean Delivery  Subjective: Patient reports tolerating PO.  Has not ambulated or voided yet,  Foley catheter in place. Pain controlled with pain meds.   Objective: Vital signs in last 24 hours: Temp:  [97.6 F (36.4 C)-98.9 F (37.2 C)] 97.8 F (36.6 C) (01/30 0300) Pulse Rate:  [79-115] 94 (01/30 0300) Resp:  [13-29] 19 (01/30 0300) BP: (82-114)/(54-79) 97/54 (01/30 0300) SpO2:  [97 %-100 %] 98 % (01/30 0300) Weight:  [172 lb (78 kg)] 172 lb (78 kg) (01/29 0830)  Physical Exam:  General: alert and no distress Lungs: clear to auscultation bilaterally Breasts: normal appearance, no masses or tenderness Heart: regular rate and rhythm, S1, S2 normal, no murmur, click, rub or gallop Pelvis: Lochia appropriate, Uterine Fundus firm, Incision: healing well, no significant drainage, no dehiscence, no significant erythema Extremities: DVT Evaluation: No evidence of DVT seen on physical exam. Negative Homan's sign. No cords or calf tenderness. No significant calf/ankle edema.   Recent Labs CBC Latest Ref Rng & Units 07/05/2016 07/01/2016  WBC 3.6 - 11.0 K/uL 11.1(H) 11.4(H)  Hemoglobin 12.0 - 16.0 g/dL 12.5 12.9  Hematocrit 35.0 - 47.0 % 35.4 37.5  Platelets 150 - 440 K/uL 210 248      Assessment/Plan: Status post Cesarean section. Doing well postoperatively.  Breastfeeding, Lactation consult and Contraception undecided Advance diet Continue PO pain management Remove foley catheter Discontinue IVF Continue current care Desires circumcision for female infant, to be performed outpatient.  Dispo: likely d/c home in 1-2 days.   Rubie Maid, MD [facility]

## 2016-07-05 NOTE — Anesthesia Post-op Follow-up Note (Signed)
  Anesthesia Pain Follow-up Note  Patient: Heather Cross  Day #: 1  Date of Follow-up: 07/05/2016 Time: 7:20 AM  Last Vitals:  Vitals:   07/04/16 2322 07/05/16 0300  BP: 107/72 (!) 97/54  Pulse: (!) 101 94  Resp: 18 19  Temp: 36.9 C 36.6 C    Level of Consciousness: alert  Pain: none   Side Effects:None  Catheter Site Exam:clean, dry     Plan: D/C from anesthesia care at surgeon's request  Virgilio Frees

## 2016-07-05 NOTE — Lactation Note (Signed)
This note was copied from a baby's chart. Lactation Consultation Note  Patient Name: Heather Cross [id number] Date: 07/05/2016  Mom states she nursed baby for 15 minutes recently and wanted him in crib. He started to root and then cry as I tried to put him in crib, so encouraged mom to keep feeding him til no longer rooting. She had been using pacifier.    Maternal Data    Feeding Feeding Type: Breast Fed Length of feed: 15 min  LATCH Score/Interventions                      Lactation Tools Discussed/Used     Consult Status      Heather Cross 07/05/2016, 2:54 PM

## 2016-07-06 ENCOUNTER — Encounter: Payer: Self-pay | Admitting: Student

## 2016-07-06 MED ORDER — IBUPROFEN 600 MG PO TABS: 600.0000 mg | ORAL_TABLET | Freq: Four times a day (QID) | ORAL | 0 refills | Status: DC

## 2016-07-06 MED ORDER — DOCUSATE SODIUM 100 MG PO CAPS: 100.0000 mg | ORAL_CAPSULE | Freq: Two times a day (BID) | ORAL | 2 refills | Status: DC | PRN

## 2016-07-06 MED ORDER — OXYCODONE-ACETAMINOPHEN 5-325 MG PO TABS: 1.0000 | ORAL_TABLET | Freq: Four times a day (QID) | ORAL | 0 refills | Status: DC | PRN

## 2016-07-06 MED ORDER — IBUPROFEN 600 MG PO TABS
600.0000 mg | ORAL_TABLET | Freq: Four times a day (QID) | ORAL | Status: DC
  Administered 2016-07-06: 600 mg via ORAL
  Filled 2016-07-06: qty 1

## 2016-07-06 NOTE — Progress Notes (Addendum)
Postpartum Day # 2: Cesarean Delivery  Subjective: Patient reports tolerating PO.  Is ambulating and has passed flatus.  Voiding without difficulty. Pain controlled with pain meds. Currently notes pain 7/10.   Objective: Vitals:   07/05/16 1202 07/05/16 1639 07/05/16 1940 07/05/16 2304  BP: 115/68  117/66 123/74  Pulse: 90  (!) 108 (!) 106  Resp: 18  20 20   Temp: 98 F (36.7 C) 98.2 F (36.8 C) 97.8 F (36.6 C) 98.3 F (36.8 C)  TempSrc: Oral Oral Oral Oral  SpO2:    97%  Weight:      Height:        Physical Exam:  General: alert and mild distress Lungs: clear to auscultation bilaterally Breasts: normal appearance, no masses or tenderness Heart: regular rate and rhythm, S1, S2 normal, no murmur, click, rub or gallop Pelvis: Lochia appropriate, Uterine Fundus firm, Incision: healing well, no significant drainage, no dehiscence, no significant erythema Extremities: DVT Evaluation: No evidence of DVT seen on physical exam. Negative Homan's sign. No cords or calf tenderness. No significant calf/ankle edema.   Recent Labs CBC Latest Ref Rng & Units 07/05/2016 07/01/2016  WBC 3.6 - 11.0 K/uL 11.1(H) 11.4(H)  Hemoglobin 12.0 - 16.0 g/dL 12.5 12.9  Hematocrit 35.0 - 47.0 % 35.4 37.5  Platelets 150 - 440 K/uL 210 248     Assessment/Plan: Status post Cesarean section. Doing well postoperatively.  Breastfeeding, Lactation consult and Contraception undecided Regular diet Continue PO pain management Continue current care Desires circumcision for female infant, to be performed outpatient.  Dispo: likely d/c home today or tomorrow.   Rubie Maid, MD [facility]

## 2016-07-06 NOTE — Discharge Instructions (Signed)

## 2016-07-08 NOTE — Discharge Summary (Signed)
Obstetric Discharge Summary Reason for Admission: cesarean section Prenatal Procedures: ultrasound Intrapartum Procedures: cesarean: low cervical, transverse Postpartum Procedures: none Complications-Operative and Postpartum: none Hemoglobin  Date Value Ref Range Status  07/05/2016 12.5 12.0 - 16.0 g/dL Final   HCT  Date Value Ref Range Status  07/05/2016 35.4 35.0 - 47.0 % Final   Hematocrit  Date Value Ref Range Status  06/08/2016 36.2 34.0 - 46.6 % Final    Physical Exam:  Vitals:   07/05/16 1639 07/05/16 1940 07/05/16 2304 07/06/16 0847  BP:  117/66 123/74 117/64  Pulse:  (!) 108 (!) 106 92  Resp:  20 20 19   Temp: 98.2 F (36.8 C) 97.8 F (36.6 C) 98.3 F (36.8 C) 97.9 F (36.6 C)  TempSrc: Oral Oral Oral Oral  SpO2:   97% 96%  Weight:      Height:       General: alert and no distress Lochia: appropriate Uterine Fundus: firm Incision: healing well, no significant drainage, no dehiscence, no significant erythema DVT Evaluation: No evidence of DVT seen on physical exam. Negative Homan's sign. No cords or calf tenderness. No significant calf/ankle edema.  Discharge Diagnoses: Term Pregnancy-delivered  Discharge Information: Date: 07/08/2016 Activity: pelvic rest Diet: routine Medications: PNV, Ibuprofen, Colace and Percocet Condition: stable Instructions: refer to practice specific booklet Discharge to: home [place], MD Follow up in 1 week(s).   Specialties:  Obstetrics and Gynecology, Radiology Why:  For post-op check Contact information: [place] [address] [phone]           Newborn Data: Live born female  Birth Weight: 8 lb 1.5 oz (3670 g) APGAR: 9, 10  Home with mother.  Rubie Maid 07/08/2016, 12:06 PM

## 2016-07-13 ENCOUNTER — Encounter: Payer: Medicaid Other | Admitting: Obstetrics and Gynecology

## 2016-07-19 ENCOUNTER — Ambulatory Visit (INDEPENDENT_AMBULATORY_CARE_PROVIDER_SITE_OTHER): Payer: Medicaid Other | Admitting: Obstetrics and Gynecology

## 2016-07-19 ENCOUNTER — Encounter: Payer: Self-pay | Admitting: Obstetrics and Gynecology

## 2016-07-19 DIAGNOSIS — R461 Bizarre personal appearance: Secondary | ICD-10-CM

## 2016-07-19 DIAGNOSIS — Z5189 Encounter for other specified aftercare: Secondary | ICD-10-CM

## 2016-07-19 DIAGNOSIS — Z609 Problem related to social environment, unspecified: Secondary | ICD-10-CM

## 2016-07-24 NOTE — Progress Notes (Signed)
    OBSTETRICS/GYNECOLOGY POST-OPERATIVE CLINIC VISIT  Subjective:     Heather Cross is a 31 y.o. G57P2002 female who presents to the clinic 2 weeks status post repeat C-section for prior h/o C-section, declined TOLAC. Eating a regular diet without difficulty. Bowel movements are normal. Pain is controlled with current analgesics. Medications being used: prescription NSAID's including ibuprofen (Motrin).   The following portions of the patient's history were reviewed and updated as appropriate: allergies, current medications, past family history, past medical history, past social history, past surgical history and problem list.  Review of Systems Pertinent items noted in HPI and remainder of comprehensive ROS otherwise negative.    Objective:    BP 128/69 (BP Location: Left Arm, Patient Position: Sitting, Cuff Size: Normal)   Pulse (!) 110   Ht 5\' 5"  (1.651 m)   Wt 149 lb 14.4 oz (68 kg)   Breastfeeding? Yes   BMI 24.94 kg/m  General:  alert and no distress.  Appears very disheveled, unkempt, and anxious.   Breasts: Left breast cracked, dry. No abnormal masses or rashes. No engorgement.  Abdomen: soft, bowel sounds active, non-tender  Incision:   healing well, no drainage, no erythema, no hernia, no seroma, no swelling, no dehiscence, incision well approximated     Assessment:    Doing well postoperatively.  Is s/p repeat C-section Bizarre apperance  Social factors  Plan:   1. Continue any current medications. 2. Wound care discussed. 3.  Patient with disheveled appearance, body odor. Infant extremely fussy, patient hesitant to feed in office.  Seen by Education officer, museum in office today, who notes that patient had previously been refusing home visits. Patient given samples of formula in office. Notes a DFACS case has been opened on patient's other son.  4. Activity restrictions: no bending, stooping, or squatting, no lifting more than 15 pounds and pelvic rest 5. Anticipated  return to work: not applicable. 6. Follow up: 3-4 weeks for postpartum visit.    Rubie Maid, MD [facility]

## 2016-07-26 ENCOUNTER — Telehealth: Payer: Self-pay | Admitting: Obstetrics and Gynecology

## 2016-07-26 NOTE — Telephone Encounter (Signed)
Marylyn with the [facility] wants to know if pt is on any birth control.  Only 3 weeks post and is already having sex.  She had a visit today from nurse.  They want to know if you can send patches to [place].    Thanks Con Memos

## 2016-08-02 NOTE — Telephone Encounter (Signed)
No, the patient is not currently on any birth control.  She should not be having intercourse at this time.  If she does need birth control immediately we can prescribed the mini-pill or give her a shot of Depo Provera if she will take it. Otherwise if she will not use medications and continues to go against medical advice strongly encourage condom usage.

## 2016-08-03 NOTE — Telephone Encounter (Signed)
Called Marylyn from [facility] and LM with her on what you had advised.

## 2016-08-23 ENCOUNTER — Encounter: Payer: Medicaid Other | Admitting: Obstetrics and Gynecology

## 2016-08-31 ENCOUNTER — Encounter: Payer: Medicaid Other | Admitting: Obstetrics and Gynecology

## 2016-09-13 ENCOUNTER — Ambulatory Visit (INDEPENDENT_AMBULATORY_CARE_PROVIDER_SITE_OTHER): Payer: Medicaid Other | Admitting: Obstetrics and Gynecology

## 2016-09-13 ENCOUNTER — Encounter: Payer: Self-pay | Admitting: Obstetrics and Gynecology

## 2016-09-13 VITALS — BP 129/82 | HR 94 | Ht 65.0 in | Wt 152.2 lb

## 2016-09-13 DIAGNOSIS — R87612 Low grade squamous intraepithelial lesion on cytologic smear of cervix (LGSIL): Secondary | ICD-10-CM

## 2016-09-13 DIAGNOSIS — Z0000 Encounter for general adult medical examination without abnormal findings: Secondary | ICD-10-CM | POA: Diagnosis not present

## 2016-09-13 DIAGNOSIS — Z30016 Encounter for initial prescription of transdermal patch hormonal contraceptive device: Secondary | ICD-10-CM

## 2016-09-13 DIAGNOSIS — Z01419 Encounter for gynecological examination (general) (routine) without abnormal findings: Secondary | ICD-10-CM

## 2016-09-13 DIAGNOSIS — Z98891 History of uterine scar from previous surgery: Secondary | ICD-10-CM | POA: Diagnosis not present

## 2016-09-13 LAB — POCT URINALYSIS DIPSTICK
Bilirubin, UA: NEGATIVE
Glucose, UA: NEGATIVE
Ketones, UA: NEGATIVE
LEUKOCYTES UA: NEGATIVE
NITRITE UA: NEGATIVE
PH UA: 6.5 (ref 5.0–8.0)
PROTEIN UA: NEGATIVE
Spec Grav, UA: 1.02 (ref 1.030–1.035)
Urobilinogen, UA: 0.2 (ref ?–2.0)

## 2016-09-13 LAB — POCT URINE PREGNANCY: Preg Test, Ur: NEGATIVE

## 2016-09-13 MED ORDER — NORELGESTROMIN-ETH ESTRADIOL 150-35 MCG/24HR TD PTWK: 1.0000 | MEDICATED_PATCH | TRANSDERMAL | 12 refills | Status: DC

## 2016-09-13 NOTE — Patient Instructions (Signed)
Colposcopy Colposcopy is a procedure to examine the lowest part of the uterus (cervix), the vagina, and the area around the vaginal opening (vulva) for abnormalities or signs of disease. The procedure is done using a lighted microscope or magnifying lens (colposcope). If any unusual cells are found during the procedure, your health care provider may remove a tissue sample for testing (biopsy). A colposcopy may be done if you:  Have an abnormal Pap test. A Pap test is a screening test that is used to check for signs of cancer or infection of the vagina, cervix, and uterus.  Have a Pap smear test in which you test positive for high-risk HPV (human papillomavirus).  Have a sore or lesion on your cervix.  Have genital warts on your vulva, vagina, or cervix.  Took certain medicines while pregnant, such as diethylstilbestrol (DES).  Have pain during sexual intercourse.  Have vaginal bleeding, especially after sexual intercourse.  Need to have a cervical polyp removed.  Need to have a lost intrauterine device (IUD) string located. Let your health care provider know about:  Any allergies you have, including allergies to prescribed medicine, latex, or iodine.  All medicines you are taking, including vitamins, herbs, eye drops, creams, and over-the-counter medicines. Bring a list of all of your medicines to your appointment.  Any problems you or family members have had with anesthetic medicines.  Any blood disorders you have.  Any surgeries you have had.  Any medical conditions you have, such as pelvic inflammatory disease (PID) or endometrial disorder.  Any history of frequent fainting.  Your menstrual cycle and what form of birth control (contraception) you use.  Your medical history, including any prior cervical treatment.  Whether you are pregnant or may be pregnant. What are the risks? Generally, this is a safe procedure. However, problems may occur,  including:  Pain.  Infection, which may include a fever, bad-smelling discharge, or pelvic pain.  Bleeding or discharge.  Misdiagnosis.  Fainting and vasovagal reactions, but this is rare.  Allergic reactions to medicines.  Damage to other structures or organs. What happens before the procedure?  If you have your menstrual period or will have it at the time of your procedure, tell your health care provider. A colposcopy typically is not done during menstruation.  Continue your contraceptive practices before and after the procedure.  For 24 hours before the colposcopy:  Do not douche.  Do not use tampons.  Do not use medicines, creams, or suppositories in the vagina.  Do not have sexual intercourse.  Ask your health care provider about:  Changing or stopping your regular medicines. This is especially important if you are taking diabetes medicines or blood thinners.  Taking medicines such as aspirin and ibuprofen. These medicines can thin your blood. Do not take these medicines before your procedure if your health care provider instructs you not to. It is likely that your health care provider will tell you to avoid taking aspirin or medicine that contains aspirin for 7 days before the procedure.  Follow instructions from your health care provider about eating or drinking restrictions. You will likely need to eat a regular diet the day of the procedure and not skip any meals.  You may have an exam or testing. A pregnancy test will be taken on the day of the procedure.  You may have a blood or urine sample taken.  Plan to have someone take you home from the hospital or clinic.  If you will be going   home right after the procedure, plan to have someone with you for 24 hours. What happens during the procedure?  You will lie down on your back, with your feet in foot rests (stirrups).  A warmed and lubricated instrument (speculum) will be inserted into your vagina. The  speculum will be used to hold apart the walls of your vagina so your health care provider can see your cervix and the inside of your vagina.  A cotton swab will be used to place a small amount of liquid solution on the areas to be examined. This solution makes it easier to see abnormal cells. You may feel a slight burning during this part.  The colposcope will be used to scan the cervix with a bright white light. The colposcope will be held near your vulvaand will magnify your vulva, vagina, and cervix for easier examination.  Your health care provider may decide to take a biopsy. If so:  You may be given medicine to numb the area (local anesthetic).  Surgical instruments will be used to suck out mucus and cells through your vagina.  You may feel mild pain while the tissue sample is removed.  Bleeding may occur. A solution may be used to stop the bleeding.  If a sample of tissue is needed from the inside of the cervix, a different procedure called endocervical curettage (ECC) may be completed. During this procedure, a curved instrument (curette) will be used to scrape cells from your cervix or the top of your cervix (endocervix).  Your health care provider will record the location of any abnormalities. The procedure may vary among health care providers and hospitals. What happens after the procedure?  You will lie down and rest for a few minutes. You may be offered juice or cookies.  Your blood pressure, heart rate, breathing rate, and blood oxygen level will be monitored until any medicines you were given have worn off.  You may have to wear compression stockings. These stockings help to prevent blood clots and reduce swelling in your legs.  You may have some cramping in your abdomen. This should go away after a few minutes. This information is not intended to replace advice given to you by your health care provider. Make sure you discuss any questions you have with your health care  provider. Document Released: 08/13/2002 Document Revised: 01/19/2016 Document Reviewed: 12/28/2015 Elsevier Interactive Patient Education  2017 [employer]  

## 2016-09-13 NOTE — Progress Notes (Signed)
GYNECOLOGY PROGRESS NOTE  Subjective:    Patient ID: Heather Cross, female    DOB: Nov 08, 1985, 31 y.o.   MRN: [id number]  HPI  Patient is a 31 y.o. G2P2002 female who presents for routine gynecologic exam and postpartum visit (patient did not show up for final postpartum visit).  She is ~ 2.5 months s/p a repeat Cesarean section.  Patient states she is doing well, Is bottle feeding.  Notes menstrual cycles have been regular.  Has resumed sexual activity.  Notes using condoms (but inconsistently). Denies postpartum depression symptoms. Denies complaints today.    Gynecologic History  Menarche age: 31 Patient's last menstrual period was 08/23/2016. Contraception: condoms History of STI's:  History of gonorrhea in 2017, treated.  Chlamydia  In 2017.  Last Pap: 12/2015. Results were: abnormal (LGSIL).  Denies h/o abnormal pap smears.    Obstetric History   G2   P2   T2   P0   A0   L2    SAB0   TAB0   Ectopic0   Multiple0   Live Births2     # Outcome Date GA Lbr Len/2nd Weight Sex Delivery Anes PTL Lv  2 Term 07/04/16 [redacted]w[redacted]d  8 lb 1.5 oz (3.67 kg) M CS-LTranv Spinal  LIV     Name: Casali,PENDINGBABY     Apgar1:  9               Apgar5: 10  1 Term 2013 [redacted]w[redacted]d  8 lb (3.629 kg) M CS-LTranv   LIV      Past Medical History:  Diagnosis Date  . Anxiety   . Depression   . H/O gonorrhea 09/2015   treated 11/2015    Past Surgical History:  Procedure Laterality Date  . CESAREAN SECTION    . CESAREAN SECTION N/A 07/04/2016   Procedure: CESAREAN SECTION/Boy, 8lbs, 1 oz;  Surgeon: Rubie Maid, MD;  Location: [facility] ORS;  Service: Obstetrics;  Laterality: N/A;  . EAR FOREIGN BODY REMOVAL     85 rocks removed from ear canal. surgery x3    Family History  Problem Relation Age of Onset  . Hypertension Mother   . Glaucoma Father   . Heart disease Maternal Grandmother     heart attack  . Cancer Neg Hx   . Diabetes Neg Hx     Social History   Social History  . Marital status:  Single    Spouse name: N/A  . Number of children: N/A  . Years of education: N/A   Occupational History  . stay at home    Social History Main Topics  . Smoking status: Former Smoker    Years: 1.00    Types: Cigarettes  . Smokeless tobacco: Never Used     Comment: 1-2 cig/day  . Alcohol use No  . Drug use: No  . Sexual activity: Yes    Partners: Male    Birth control/ protection: None   Other Topics Concern  . Not on file   Social History Narrative  . No narrative on file    No current outpatient prescriptions on file prior to visit.   No current facility-administered medications on file prior to visit.     No Known Allergies    Review of Systems Constitutional: negative for chills, fatigue, fevers and sweats Eyes: negative for irritation, redness and visual disturbance Ears, nose, mouth, throat, and face: negative for hearing loss, nasal congestion, snoring and tinnitus Respiratory: negative for asthma, cough, sputum Cardiovascular:  negative for chest pain, dyspnea, exertional chest pressure/discomfort, irregular heart beat, palpitations and syncope Gastrointestinal: negative for abdominal pain, change in bowel habits, nausea and vomiting Genitourinary: negative for abnormal menstrual periods, genital lesions, sexual problems and vaginal discharge, dysuria and urinary incontinence Integument/breast: negative for breast lump, breast tenderness and nipple discharge Hematologic/lymphatic: negative for bleeding and easy bruising Musculoskeletal:negative for back pain and muscle weakness Neurological: negative for dizziness, headaches, vertigo and weakness Endocrine: negative for diabetic symptoms including polydipsia, polyuria and skin dryness Allergic/Immunologic: negative for hay fever and urticaria        Objective:  Blood pressure 129/82, pulse 94, height 5\' 5"  (1.651 m), weight 152 lb 3.2 oz (69 kg), last menstrual period 08/23/2016, not currently  breastfeeding. Body mass index is 25.33 kg/m.  General Appearance:    Alert, cooperative, no distress, appears stated age  Head:    Normocephalic, without obvious abnormality, atraumatic  Eyes:    PERRL, conjunctiva/corneas clear, EOM's intact, both eyes  Ears:    Normal external ear canals, both ears  Nose:   Nares normal, septum midline, mucosa normal, no drainage or sinus tenderness  Throat:   Lips, mucosa, and tongue normal; teeth and gums normal  Neck:   Supple, symmetrical, trachea midline, no adenopathy; thyroid: no enlargement/tenderness/nodules; no carotid bruit or JVD  Back:     Symmetric, no curvature, ROM normal, no CVA tenderness  Lungs:     Clear to auscultation bilaterally, respirations unlabored  Chest Wall:    No tenderness or deformity   Heart:    Regular rate and rhythm, S1 and S2 normal, no murmur, rub or gallop  Breast Exam:    No tenderness, masses, or nipple abnormality  Abdomen:     Soft, non-tender, bowel sounds active all four quadrants, no masses, no organomegaly.  Well healed Pfannenstiel incision.   Genitalia:    Pelvic:external genitalia normal, vagina without lesions, discharge, or tenderness, rectovaginal septum  normal. Cervix normal in appearance, no cervical motion tenderness, no adnexal masses or tenderness.  Uterus normal size, shape, mobile, regular contours, nontender.  Rectal:    Normal external sphincter.  No hemorrhoids appreciated. Internal exam not done.   Extremities:   Extremities normal, atraumatic, no cyanosis or edema  Pulses:   2+ and symmetric all extremities  Skin:   Skin color, texture, turgor normal, no rashes or lesions  Lymph nodes:   Cervical, supraclavicular, and axillary nodes normal  Neurologic:   CNII-XII intact, normal strength, sensation and reflexes throughout   .  Labs:  Lab Results  Component Value Date   WBC 11.1 (H) 07/05/2016   HGB 12.5 07/05/2016   HCT 35.4 07/05/2016   MCV 86.2 07/05/2016   PLT 210 07/05/2016     No results found for: CREATININE, BUN, NA, K, CL, CO2  No results found for: ALT, AST, GGT, ALKPHOS, BILITOT  No results found for: TSH   Assessment:    Healthy female exam.   H/o Cesarean section Contraception management Abnormal pap smear  Plan:     Blood tests: Comprehensive metabolic panel. Breast self exam technique reviewed and patient encouraged to perform self-exam monthly. Contraception: condoms currently but desires to begin contraceptive patches.  Will prescribe. Discussed starting after next menstrual cycle, due in 1.5 weeks. Discussed healthy lifestyle modifications. Patient with abnormal pap smear during pregnancy, needs colposcopy now that she is no longer pregnant. Will schedule to complete in 2-3weeks.       Rubie Maid, MD [facility]

## 2016-10-06 ENCOUNTER — Encounter: Payer: Medicaid Other | Admitting: Obstetrics and Gynecology

## 2016-10-26 ENCOUNTER — Ambulatory Visit (INDEPENDENT_AMBULATORY_CARE_PROVIDER_SITE_OTHER): Payer: Medicaid Other | Admitting: Obstetrics and Gynecology

## 2016-10-26 ENCOUNTER — Other Ambulatory Visit: Payer: Self-pay | Admitting: Obstetrics and Gynecology

## 2016-10-26 ENCOUNTER — Encounter: Payer: Self-pay | Admitting: Obstetrics and Gynecology

## 2016-10-26 VITALS — BP 104/67 | HR 84 | Ht 65.0 in | Wt 153.9 lb

## 2016-10-26 DIAGNOSIS — R87612 Low grade squamous intraepithelial lesion on cytologic smear of cervix (LGSIL): Secondary | ICD-10-CM

## 2016-10-26 NOTE — Addendum Note (Signed)
Addended by: Gordy Clement C on: 10/26/2016 11:59 AM   Modules accepted: Orders

## 2016-10-26 NOTE — Progress Notes (Signed)
    GYNECOLOGY CLINIC COLPOSCOPY PROCEDURE NOTE  31 y.o. [id number] here for colposcopy for low-grade squamous intraepithelial neoplasia (LGSIL - encompassing HPV,mild dysplasia,CIN I) pap smear on 12/2015 during pregnancy. Discussed role for HPV in cervical dysplasia, need for surveillance.  Patient given informed consent, signed copy in the chart, time out was performed.  Placed in lithotomy position. Cervix viewed with speculum and colposcope after application of acetic acid.   Colposcopy adequate? Yes  acetowhite lesion(s) noted at 12 o'clock; corresponding biopsies obtained.  ECC specimen obtained. All specimens were labeled and sent to pathology.   Patient was given post procedure instructions.  Will follow up pathology and manage accordingly; patient will be contacted with results and recommendations.  Routine preventative health maintenance measures emphasized.    Rubie Maid, MD [facility]

## 2016-10-28 ENCOUNTER — Encounter: Payer: Self-pay | Admitting: Obstetrics and Gynecology

## 2016-10-28 LAB — PATHOLOGY

## 2017-01-31 ENCOUNTER — Encounter: Payer: Medicaid Other | Admitting: Obstetrics and Gynecology

## 2017-02-21 ENCOUNTER — Encounter: Payer: Self-pay | Admitting: Obstetrics and Gynecology

## 2017-02-21 ENCOUNTER — Ambulatory Visit (INDEPENDENT_AMBULATORY_CARE_PROVIDER_SITE_OTHER): Payer: Medicaid Other | Admitting: Obstetrics and Gynecology

## 2017-02-21 VITALS — BP 131/89 | HR 111 | Ht 65.0 in | Wt 157.0 lb

## 2017-02-21 DIAGNOSIS — Z87898 Personal history of other specified conditions: Secondary | ICD-10-CM

## 2017-02-21 DIAGNOSIS — N898 Other specified noninflammatory disorders of vagina: Secondary | ICD-10-CM | POA: Diagnosis not present

## 2017-02-21 DIAGNOSIS — F329 Major depressive disorder, single episode, unspecified: Secondary | ICD-10-CM

## 2017-02-21 DIAGNOSIS — Z0001 Encounter for general adult medical examination with abnormal findings: Secondary | ICD-10-CM

## 2017-02-21 DIAGNOSIS — Z113 Encounter for screening for infections with a predominantly sexual mode of transmission: Secondary | ICD-10-CM

## 2017-02-21 DIAGNOSIS — F419 Anxiety disorder, unspecified: Secondary | ICD-10-CM

## 2017-02-21 DIAGNOSIS — Z01419 Encounter for gynecological examination (general) (routine) without abnormal findings: Secondary | ICD-10-CM | POA: Diagnosis not present

## 2017-02-21 DIAGNOSIS — Z8742 Personal history of other diseases of the female genital tract: Secondary | ICD-10-CM

## 2017-02-21 NOTE — Progress Notes (Signed)
GYNECOLOGY ANNUAL PHYSICAL EXAM PROGRESS NOTE  Subjective:    Heather Cross is a 31 y.o. G46P2002 female who presents for an annual exam. The patient is sexually active.  The patient wears seatbelts: no. The patient participates in regular exercise: no. Has the patient ever been transfused or tattooed?: no. The patient reports that there is not domestic violence in her life.   The patient has the following complaints today:  1. Patient notes increased stress levels at home, states that FOB is not helping her out around the house, is having to do everything caring for her 2 children (including her 59 month old infant)  herself.   Notes she was seen by her therapist recently who suggested she resume her meds (has a h/o anxiety and depression), but notes she has not done so as of yet as she is still hesitant about taking meds.    Gynecologic History Patient's last menstrual period was 02/03/2017. Menarche age: 31 Contraception: natural family planning History of STI's: H/o gonorrhea in 2017 Last Pap: 12/2015. 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  Denies h/o abnormal pap smears.   Obstetric History   G2   P2   T2   P0   A0   L2    SAB0   TAB0   Ectopic0   Multiple0   Live Births2     # Outcome Date GA Lbr Len/2nd Weight Sex Delivery Anes PTL Lv  2 Term 07/04/16 [redacted]w[redacted]d  8 lb 1.5 oz (3.67 kg) M CS-LTranv Spinal  LIV     Name: Sylvia,PENDINGBABY     Apgar1:  9               Apgar5: 10  1 Term 2013 100w2d  8 lb (3.629 kg) M CS-LTranv   LIV      Past Medical History:  Diagnosis Date  . Anxiety   . Depression   . H/O gonorrhea 09/2015   treated 11/2015    Past Surgical History:  Procedure Laterality Date  . CESAREAN SECTION    . CESAREAN SECTION N/A 07/04/2016   Procedure: CESAREAN SECTION/Boy, 8lbs, 1 oz;  Surgeon: Rubie Maid, MD;  Location: [facility] ORS;  Service: Obstetrics;  Laterality: N/A;  . EAR FOREIGN BODY REMOVAL     85  rocks removed from ear canal. surgery x3    Family History  Problem Relation Age of Onset  . Hypertension Mother   . Glaucoma Father   . Heart disease Maternal Grandmother        heart attack  . Cancer Neg Hx   . Diabetes Neg Hx     Social History   Social History  . Marital status: Single    Spouse name: N/A  . Number of children: N/A  . Years of education: N/A   Occupational History  . stay at home    Social History Main Topics  . Smoking status: Former Smoker    Years: 1.00    Types: Cigarettes  . Smokeless tobacco: Never Used     Comment: 1-2 cig/day  . Alcohol use No  . Drug use: No  . Sexual activity: Yes    Partners: Male    Birth control/ protection: None   Other Topics Concern  . Not on file   Social History Narrative  . No narrative on file    No current outpatient prescriptions on file prior to visit.   No current facility-administered  medications on file prior to visit.     No Known Allergies   Review of Systems Constitutional: negative for chills, fatigue, fevers and sweats Eyes: negative for irritation, redness and visual disturbance Ears, nose, mouth, throat, and face: negative for hearing loss, nasal congestion, snoring and tinnitus Respiratory: negative for asthma, cough, sputum Cardiovascular: negative for chest pain, dyspnea, exertional chest pressure/discomfort, irregular heart beat, palpitations and syncope Gastrointestinal: negative for abdominal pain, change in bowel habits, nausea and vomiting Genitourinary: Positive for vaginal discharge with odor.  Negative for abnormal menstrual periods, genital lesions, sexual problems, dysuria and urinary incontinence Integument/breast: negative for breast lump, breast tenderness and nipple discharge Hematologic/lymphatic: negative for bleeding and easy bruising Musculoskeletal:negative for back pain and muscle weakness Neurological: negative for dizziness, headaches, vertigo and  weakness Endocrine: negative for diabetic symptoms including polydipsia, polyuria and skin dryness Allergic/Immunologic: negative for hay fever and urticaria    Psychological: Positive for anxiety, concentration difficulties and irritability.  Negative for - depression, mood swings or suicidal ideation     Objective:  Blood pressure 131/89, pulse (!) 111, height 5\' 5"  (1.651 m), weight 157 lb (71.2 kg), last menstrual period 02/03/2017, not currently breastfeeding. Body mass index is 26.13 kg/m.   General Appearance:    Alert, cooperative, no distress, appears stated age. Moderate body odor present.  Overweight.   Head:    Normocephalic, without obvious abnormality, atraumatic  Eyes:    PERRL, conjunctiva/corneas clear, EOM's intact, both eyes  Ears:    Normal external ear canals, both ears  Nose:   Nares normal, septum midline, mucosa normal, no drainage or sinus tenderness  Throat:   Lips, mucosa, and tongue normal; teeth and gums normal  Neck:   Supple, symmetrical, trachea midline, no adenopathy; thyroid: no enlargement/tenderness/nodules; no carotid bruit or JVD  Back:     Symmetric, no curvature, ROM normal, no CVA tenderness  Lungs:     Clear to auscultation bilaterally, respirations unlabored  Chest Wall:    No tenderness or deformity   Heart:    Regular rate and rhythm, S1 and S2 normal, no murmur, rub or gallop  Breast Exam:    No tenderness, masses, or nipple abnormality  Abdomen:     Soft, non-tender, bowel sounds active all four quadrants, no masses, no organomegaly.    Genitalia:    Pelvic:external genitalia normal, vagina without lesions, discharge, or tenderness, rectovaginal septum  normal. Cervix normal in appearance, no cervical motion tenderness, no adnexal masses or tenderness.  Uterus normal size, shape, mobile, regular contours, nontender.  Rectal:    Normal external sphincter.  No hemorrhoids appreciated. Internal exam not done.   Extremities:   Extremities normal,  atraumatic, no cyanosis or edema  Pulses:   2+ and symmetric all extremities  Skin:   Skin color, texture, turgor normal, no rashes or lesions  Lymph nodes:   Cervical, supraclavicular, and axillary nodes normal  Neurologic:   CNII-XII intact, normal strength, sensation and reflexes throughout   .  Labs:  Lab Results  Component Value Date   WBC 11.1 (H) 07/05/2016   HGB 12.5 07/05/2016   HCT 35.4 07/05/2016   MCV 86.2 07/05/2016   PLT 210 07/05/2016    No results found for: CREATININE, BUN, NA, K, CL, CO2  No results found for: ALT, AST, GGT, ALKPHOS, BILITOT  No results found for: TSH   Assessment:   Routine gynecologic exam Anxiety Vaginal discharge H/o abnormal pap smear (LGSIL), with CIN I  Plan:    -  Blood tests: CBC with diff and Comprehensive metabolic panel. - Breast self exam technique reviewed and patient encouraged to perform self-exam monthly. - Contraception: family planning. Discussed method of family planning as it was clear that patient was not completely knowledgeable regarding this method of contraception. Continues to decline other methods. Suggested fertility tracking app for phone and to avoid intercourse during ovulation week.  - Discussed healthy lifestyle modifications. - Nuswab performed for vaginal discharge, as patient also desires STI testing.  - Pap smear performed today due to h/o abnormal pap smear.  - Encouraged patient to resume medications (even if for a short term of 3-6 months) to help cope with home stress situations.  To also f/u with counseling if indicated.  - Follow up in 1 year for annual exam.    Rubie Maid, MD [facility]

## 2017-02-21 NOTE — Patient Instructions (Addendum)
Health Maintenance, Female Adopting a healthy lifestyle and getting preventive care can go a long way to promote health and wellness. Talk with your health care provider about what schedule of regular examinations is right for you. This is a good chance for you to check in with your provider about disease prevention and staying healthy. In between checkups, there are plenty of things you can do on your own. Experts have done a lot of research about which lifestyle changes and preventive measures are most likely to keep you healthy. Ask your health care provider for more information. Weight and diet Eat a healthy diet  Be sure to include plenty of vegetables, fruits, low-fat dairy products, and lean protein.  Do not eat a lot of foods high in solid fats, added sugars, or salt.  Get regular exercise. This is one of the most important things you can do for your health. ? Most adults should exercise for at least 150 minutes each week. The exercise should increase your heart rate and make you sweat (moderate-intensity exercise). ? Most adults should also do strengthening exercises at least twice a week. This is in addition to the moderate-intensity exercise.  Maintain a healthy weight  Body mass index (BMI) is a measurement that can be used to identify possible weight problems. It estimates body fat based on height and weight. Your health care provider can help determine your BMI and help you achieve or maintain a healthy weight.  For females 20 years of age and older: ? A BMI below 18.5 is considered underweight. ? A BMI of 18.5 to 24.9 is normal. ? A BMI of 25 to 29.9 is considered overweight. ? A BMI of 30 and above is considered obese.  Watch levels of cholesterol and blood lipids  You should start having your blood tested for lipids and cholesterol at 30 years of age, then have this test every 5 years.  You may need to have your cholesterol levels checked more often if: ? Your lipid or  cholesterol levels are high. ? You are older than 30 years of age. ? You are at high risk for heart disease.  Cancer screening Lung Cancer  Lung cancer screening is recommended for adults 55-80 years old who are at high risk for lung cancer because of a history of smoking.  A yearly low-dose CT scan of the lungs is recommended for people who: ? Currently smoke. ? Have quit within the past 15 years. ? Have at least a 30-pack-year history of smoking. A pack year is smoking an average of one pack of cigarettes a day for 1 year.  Yearly screening should continue until it has been 15 years since you quit.  Yearly screening should stop if you develop a health problem that would prevent you from having lung cancer treatment.  Breast Cancer  Practice breast self-awareness. This means understanding how your breasts normally appear and feel.  It also means doing regular breast self-exams. Let your health care provider know about any changes, no matter how small.  If you are in your 20s or 30s, you should have a clinical breast exam (CBE) by a health care provider every 1-3 years as part of a regular health exam.  If you are 40 or older, have a CBE every year. Also consider having a breast X-ray (mammogram) every year.  If you have a family history of breast cancer, talk to your health care provider about genetic screening.  If you are at high risk   for breast cancer, talk to your health care provider about having an MRI and a mammogram every year.  Breast cancer gene (BRCA) assessment is recommended for women who have family members with BRCA-related cancers. BRCA-related cancers include: ? Breast. ? Ovarian. ? Tubal. ? Peritoneal cancers.  Results of the assessment will determine the need for genetic counseling and BRCA1 and BRCA2 testing.  Cervical Cancer Your health care provider may recommend that you be screened regularly for cancer of the pelvic organs (ovaries, uterus, and  vagina). This screening involves a pelvic examination, including checking for microscopic changes to the surface of your cervix (Pap test). You may be encouraged to have this screening done every 3 years, beginning at age 22.  For women ages 56-65, health care providers may recommend pelvic exams and Pap testing every 3 years, or they may recommend the Pap and pelvic exam, combined with testing for human papilloma virus (HPV), every 5 years. Some types of HPV increase your risk of cervical cancer. Testing for HPV may also be done on women of any age with unclear Pap test results.  Other health care providers may not recommend any screening for nonpregnant women who are considered low risk for pelvic cancer and who do not have symptoms. Ask your health care provider if a screening pelvic exam is right for you.  If you have had past treatment for cervical cancer or a condition that could lead to cancer, you need Pap tests and screening for cancer for at least 20 years after your treatment. If Pap tests have been discontinued, your risk factors (such as having a new sexual partner) need to be reassessed to determine if screening should resume. Some women have medical problems that increase the chance of getting cervical cancer. In these cases, your health care provider may recommend more frequent screening and Pap tests.  Colorectal Cancer  This type of cancer can be detected and often prevented.  Routine colorectal cancer screening usually begins at 30 years of age and continues through 30 years of age.  Your health care provider may recommend screening at an earlier age if you have risk factors for colon cancer.  Your health care provider may also recommend using home test kits to check for hidden blood in the stool.  A small camera at the end of a tube can be used to examine your colon directly (sigmoidoscopy or colonoscopy). This is done to check for the earliest forms of colorectal  cancer.  Routine screening usually begins at age 33.  Direct examination of the colon should be repeated every 5-10 years through 30 years of age. However, you may need to be screened more often if early forms of precancerous polyps or small growths are found.  Skin Cancer  Check your skin from head to toe regularly.  Tell your health care provider about any new moles or changes in moles, especially if there is a change in a mole's shape or color.  Also tell your health care provider if you have a mole that is larger than the size of a pencil eraser.  Always use sunscreen. Apply sunscreen liberally and repeatedly throughout the day.  Protect yourself by wearing long sleeves, pants, a wide-brimmed hat, and sunglasses whenever you are outside.  Heart disease, diabetes, and high blood pressure  High blood pressure causes heart disease and increases the risk of stroke. High blood pressure is more likely to develop in: ? People who have blood pressure in the high end of  the normal range (130-139/85-89 mm Hg). ? People who are overweight or obese. ? People who are African American.  If you are 21-29 years of age, have your blood pressure checked every 3-5 years. If you are 3 years of age or older, have your blood pressure checked every year. You should have your blood pressure measured twice-once when you are at a hospital or clinic, and once when you are not at a hospital or clinic. Record the average of the two measurements. To check your blood pressure when you are not at a hospital or clinic, you can use: ? An automated blood pressure machine at a pharmacy. ? A home blood pressure monitor.  If you are between 17 years and 37 years old, ask your health care provider if you should take aspirin to prevent strokes.  Have regular diabetes screenings. This involves taking a blood sample to check your fasting blood sugar level. ? If you are at a normal weight and have a low risk for diabetes,  have this test once every three years after 30 years of age. ? If you are overweight and have a high risk for diabetes, consider being tested at a younger age or more often. Preventing infection Hepatitis B  If you have a higher risk for hepatitis B, you should be screened for this virus. You are considered at high risk for hepatitis B if: ? You were born in a country where hepatitis B is common. Ask your health care provider which countries are considered high risk. ? Your parents were born in a high-risk country, and you have not been immunized against hepatitis B (hepatitis B vaccine). ? You have HIV or AIDS. ? You use needles to inject street drugs. ? You live with someone who has hepatitis B. ? You have had sex with someone who has hepatitis B. ? You get hemodialysis treatment. ? You take certain medicines for conditions, including cancer, organ transplantation, and autoimmune conditions.  Hepatitis C  Blood testing is recommended for: ? Everyone born from [birth year] through 1964. ? Anyone with known risk factors for hepatitis C.  Sexually transmitted infections (STIs)  You should be screened for sexually transmitted infections (STIs) including gonorrhea and chlamydia if: ? You are sexually active and are younger than 30 years of age. ? You are older than 30 years of age and your health care provider tells you that you are at risk for this type of infection. ? Your sexual activity has changed since you were last screened and you are at an increased risk for chlamydia or gonorrhea. Ask your health care provider if you are at risk.  If you do not have HIV, but are at risk, it may be recommended that you take a prescription medicine daily to prevent HIV infection. This is called pre-exposure prophylaxis (PrEP). You are considered at risk if: ? You are sexually active and do not regularly use condoms or know the HIV status of your partner(s). ? You take drugs by injection. ? You are  sexually active with a partner who has HIV.  Talk with your health care provider about whether you are at high risk of being infected with HIV. If you choose to begin PrEP, you should first be tested for HIV. You should then be tested every 3 months for as long as you are taking PrEP. Pregnancy  If you are premenopausal and you may become pregnant, ask your health care provider about preconception counseling.  If you may become  pregnant, take 400 to 800 micrograms (mcg) of folic acid every day.  If you want to prevent pregnancy, talk to your health care provider about birth control (contraception). Osteoporosis and menopause  Osteoporosis is a disease in which the bones lose minerals and strength with aging. This can result in serious bone fractures. Your risk for osteoporosis can be identified using a bone density scan.  If you are 38 years of age or older, or if you are at risk for osteoporosis and fractures, ask your health care provider if you should be screened.  Ask your health care provider whether you should take a calcium or vitamin D supplement to lower your risk for osteoporosis.  Menopause may have certain physical symptoms and risks.  Hormone replacement therapy may reduce some of these symptoms and risks. Talk to your health care provider about whether hormone replacement therapy is right for you. Follow these instructions at home:  Schedule regular health, dental, and eye exams.  Stay current with your immunizations.  Do not use any tobacco products including cigarettes, chewing tobacco, or electronic cigarettes.  If you are pregnant, do not drink alcohol.  If you are breastfeeding, limit how much and how often you drink alcohol.  Limit alcohol intake to no more than 1 drink per day for nonpregnant women. One drink equals 12 ounces of beer, 5 ounces of wine, or 1 ounces of hard liquor.  Do not use street drugs.  Do not share needles.  Ask your health care  provider for help if you need support or information about quitting drugs.  Tell your health care provider if you often feel depressed.  Tell your health care provider if you have ever been abused or do not feel safe at home. This information is not intended to replace advice given to you by your health care provider. Make sure you discuss any questions you have with your health care provider. Document Released: 12/06/2010 Document Revised: 10/29/2015 Document Reviewed: 02/24/2015 Elsevier Interactive Patient Education  2018 [place] Maintenance, Female Adopting a healthy lifestyle and getting preventive care can go a long way to promote health and wellness. Talk with your health care provider about what schedule of regular examinations is right for you. This is a good chance for you to check in with your provider about disease prevention and staying healthy. In between checkups, there are plenty of things you can do on your own. Experts have done a lot of research about which lifestyle changes and preventive measures are most likely to keep you healthy. Ask your health care provider for more information. Weight and diet Eat a healthy diet  Be sure to include plenty of vegetables, fruits, low-fat dairy products, and lean protein.  Do not eat a lot of foods high in solid fats, added sugars, or salt.  Get regular exercise. This is one of the most important things you can do for your health. ? Most adults should exercise for at least 150 minutes each week. The exercise should increase your heart rate and make you sweat (moderate-intensity exercise). ? Most adults should also do strengthening exercises at least twice a week. This is in addition to the moderate-intensity exercise.  Maintain a healthy weight  Body mass index (BMI) is a measurement that can be used to identify possible weight problems. It estimates body fat based on height and weight. Your health care provider can help  determine your BMI and help you achieve or maintain a healthy weight.  For  females 62 years of age and older: ? A BMI below 18.5 is considered underweight. ? A BMI of 18.5 to 24.9 is normal. ? A BMI of 25 to 29.9 is considered overweight. ? A BMI of 30 and above is considered obese.  Watch levels of cholesterol and blood lipids  You should start having your blood tested for lipids and cholesterol at 30 years of age, then have this test every 5 years.  You may need to have your cholesterol levels checked more often if: ? Your lipid or cholesterol levels are high. ? You are older than 30 years of age. ? You are at high risk for heart disease.  Cancer screening Lung Cancer  Lung cancer screening is recommended for adults 43-33 years old who are at high risk for lung cancer because of a history of smoking.  A yearly low-dose CT scan of the lungs is recommended for people who: ? Currently smoke. ? Have quit within the past 15 years. ? Have at least a 30-pack-year history of smoking. A pack year is smoking an average of one pack of cigarettes a day for 1 year.  Yearly screening should continue until it has been 15 years since you quit.  Yearly screening should stop if you develop a health problem that would prevent you from having lung cancer treatment.  Breast Cancer  Practice breast self-awareness. This means understanding how your breasts normally appear and feel.  It also means doing regular breast self-exams. Let your health care provider know about any changes, no matter how small.  If you are in your 20s or 30s, you should have a clinical breast exam (CBE) by a health care provider every 1-3 years as part of a regular health exam.  If you are 70 or older, have a CBE every year. Also consider having a breast X-ray (mammogram) every year.  If you have a family history of breast cancer, talk to your health care provider about genetic screening.  If you are at high risk for  breast cancer, talk to your health care provider about having an MRI and a mammogram every year.  Breast cancer gene (BRCA) assessment is recommended for women who have family members with BRCA-related cancers. BRCA-related cancers include: ? Breast. ? Ovarian. ? Tubal. ? Peritoneal cancers.  Results of the assessment will determine the need for genetic counseling and BRCA1 and BRCA2 testing.  Cervical Cancer Your health care provider may recommend that you be screened regularly for cancer of the pelvic organs (ovaries, uterus, and vagina). This screening involves a pelvic examination, including checking for microscopic changes to the surface of your cervix (Pap test). You may be encouraged to have this screening done every 3 years, beginning at age 21.  For women ages 42-65, health care providers may recommend pelvic exams and Pap testing every 3 years, or they may recommend the Pap and pelvic exam, combined with testing for human papilloma virus (HPV), every 5 years. Some types of HPV increase your risk of cervical cancer. Testing for HPV may also be done on women of any age with unclear Pap test results.  Other health care providers may not recommend any screening for nonpregnant women who are considered low risk for pelvic cancer and who do not have symptoms. Ask your health care provider if a screening pelvic exam is right for you.  If you have had past treatment for cervical cancer or a condition that could lead to cancer, you need Pap tests and  screening for cancer for at least 20 years after your treatment. If Pap tests have been discontinued, your risk factors (such as having a new sexual partner) need to be reassessed to determine if screening should resume. Some women have medical problems that increase the chance of getting cervical cancer. In these cases, your health care provider may recommend more frequent screening and Pap tests.  Colorectal Cancer  This type of cancer can be  detected and often prevented.  Routine colorectal cancer screening usually begins at 30 years of age and continues through 30 years of age.  Your health care provider may recommend screening at an earlier age if you have risk factors for colon cancer.  Your health care provider may also recommend using home test kits to check for hidden blood in the stool.  A small camera at the end of a tube can be used to examine your colon directly (sigmoidoscopy or colonoscopy). This is done to check for the earliest forms of colorectal cancer.  Routine screening usually begins at age 51.  Direct examination of the colon should be repeated every 5-10 years through 30 years of age. However, you may need to be screened more often if early forms of precancerous polyps or small growths are found.  Skin Cancer  Check your skin from head to toe regularly.  Tell your health care provider about any new moles or changes in moles, especially if there is a change in a mole's shape or color.  Also tell your health care provider if you have a mole that is larger than the size of a pencil eraser.  Always use sunscreen. Apply sunscreen liberally and repeatedly throughout the day.  Protect yourself by wearing long sleeves, pants, a wide-brimmed hat, and sunglasses whenever you are outside.  Heart disease, diabetes, and high blood pressure  High blood pressure causes heart disease and increases the risk of stroke. High blood pressure is more likely to develop in: ? People who have blood pressure in the high end of the normal range (130-139/85-89 mm Hg). ? People who are overweight or obese. ? People who are African American.  If you are 60-77 years of age, have your blood pressure checked every 3-5 years. If you are 25 years of age or older, have your blood pressure checked every year. You should have your blood pressure measured twice-once when you are at a hospital or clinic, and once when you are not at a  hospital or clinic. Record the average of the two measurements. To check your blood pressure when you are not at a hospital or clinic, you can use: ? An automated blood pressure machine at a pharmacy. ? A home blood pressure monitor.  If you are between 43 years and 31 years old, ask your health care provider if you should take aspirin to prevent strokes.  Have regular diabetes screenings. This involves taking a blood sample to check your fasting blood sugar level. ? If you are at a normal weight and have a low risk for diabetes, have this test once every three years after 30 years of age. ? If you are overweight and have a high risk for diabetes, consider being tested at a younger age or more often. Preventing infection Hepatitis B  If you have a higher risk for hepatitis B, you should be screened for this virus. You are considered at high risk for hepatitis B if: ? You were born in a country where hepatitis B is common. Ask  your health care provider which countries are considered high risk. ? Your parents were born in a high-risk country, and you have not been immunized against hepatitis B (hepatitis B vaccine). ? You have HIV or AIDS. ? You use needles to inject street drugs. ? You live with someone who has hepatitis B. ? You have had sex with someone who has hepatitis B. ? You get hemodialysis treatment. ? You take certain medicines for conditions, including cancer, organ transplantation, and autoimmune conditions.  Hepatitis C  Blood testing is recommended for: ? Everyone born from [birth year] through 1964. ? Anyone with known risk factors for hepatitis C.  Sexually transmitted infections (STIs)  You should be screened for sexually transmitted infections (STIs) including gonorrhea and chlamydia if: ? You are sexually active and are younger than 30 years of age. ? You are older than 30 years of age and your health care provider tells you that you are at risk for this type of  infection. ? Your sexual activity has changed since you were last screened and you are at an increased risk for chlamydia or gonorrhea. Ask your health care provider if you are at risk.  If you do not have HIV, but are at risk, it may be recommended that you take a prescription medicine daily to prevent HIV infection. This is called pre-exposure prophylaxis (PrEP). You are considered at risk if: ? You are sexually active and do not regularly use condoms or know the HIV status of your partner(s). ? You take drugs by injection. ? You are sexually active with a partner who has HIV.  Talk with your health care provider about whether you are at high risk of being infected with HIV. If you choose to begin PrEP, you should first be tested for HIV. You should then be tested every 3 months for as long as you are taking PrEP. Pregnancy  If you are premenopausal and you may become pregnant, ask your health care provider about preconception counseling.  If you may become pregnant, take 400 to 800 micrograms (mcg) of folic acid every day.  If you want to prevent pregnancy, talk to your health care provider about birth control (contraception). Osteoporosis and menopause  Osteoporosis is a disease in which the bones lose minerals and strength with aging. This can result in serious bone fractures. Your risk for osteoporosis can be identified using a bone density scan.  If you are 38 years of age or older, or if you are at risk for osteoporosis and fractures, ask your health care provider if you should be screened.  Ask your health care provider whether you should take a calcium or vitamin D supplement to lower your risk for osteoporosis.  Menopause may have certain physical symptoms and risks.  Hormone replacement therapy may reduce some of these symptoms and risks. Talk to your health care provider about whether hormone replacement therapy is right for you. Follow these instructions at home:  Schedule  regular health, dental, and eye exams.  Stay current with your immunizations.  Do not use any tobacco products including cigarettes, chewing tobacco, or electronic cigarettes.  If you are pregnant, do not drink alcohol.  If you are breastfeeding, limit how much and how often you drink alcohol.  Limit alcohol intake to no more than 1 drink per day for nonpregnant women. One drink equals 12 ounces of beer, 5 ounces of wine, or 1 ounces of hard liquor.  Do not use street drugs.  Do not share  needles.  Ask your health care provider for help if you need support or information about quitting drugs.  Tell your health care provider if you often feel depressed.  Tell your health care provider if you have ever been abused or do not feel safe at home. This information is not intended to replace advice given to you by your health care provider. Make sure you discuss any questions you have with your health care provider. Document Released: 12/06/2010 Document Revised: 10/29/2015 Document Reviewed: 02/24/2015 Elsevier Interactive Patient Education  2018 [place] [employer] and Stress Management Stress is a normal reaction to life events. It is what you feel when life demands more than you are used to or more than you can handle. Some stress can be useful. For example, the stress reaction can help you catch the last bus of the day, study for a test, or meet a deadline at work. But stress that occurs too often or for too long can cause problems. It can affect your emotional health and interfere with relationships and normal daily activities. Too much stress can weaken your immune system and increase your risk for physical illness. If you already have a medical problem, stress can make it worse. What are the causes? All sorts of life events may cause stress. An event that causes stress for one person may not be stressful for another person. Major life events commonly cause stress. These may be  positive or negative. Examples include losing your job, moving into a new home, getting married, having a baby, or losing a loved one. Less obvious life events may also cause stress, especially if they occur day after day or in combination. Examples include working long hours, driving in traffic, caring for children, being in debt, or being in a difficult relationship. What are the signs or symptoms? Stress may cause emotional symptoms including, the following:  Anxiety. This is feeling worried, afraid, on edge, overwhelmed, or out of control.  Anger. This is feeling irritated or impatient.  Depression. This is feeling sad, down, helpless, or guilty.  Difficulty focusing, remembering, or making decisions.  Stress may cause physical symptoms, including the following:  Aches and pains. These may affect your head, neck, back, stomach, or other areas of your body.  Tight muscles or clenched jaw.  Low energy or trouble sleeping.  Stress may cause unhealthy behaviors, including the following:  Eating to feel better (overeating) or skipping meals.  Sleeping too little, too much, or both.  Working too much or putting off tasks (procrastination).  Smoking, drinking alcohol, or using drugs to feel better.  How is this diagnosed? Stress is diagnosed through an assessment by your health care provider. Your health care provider will ask questions about your symptoms and any stressful life events.Your health care provider will also ask about your medical history and may order blood tests or other tests. Certain medical conditions and medicine can cause physical symptoms similar to stress. Mental illness can cause emotional symptoms and unhealthy behaviors similar to stress. Your health care provider may refer you to a mental health professional for further evaluation. How is this treated? Stress management is the recommended treatment for stress.The goals of stress management are reducing  stressful life events and coping with stress in healthy ways. Techniques for reducing stressful life events include the following:  Stress identification. Self-monitor for stress and identify what causes stress for you. These skills may help you to avoid some stressful events.  Time management. Set your  priorities, keep a calendar of events, and learn to say "no." These tools can help you avoid making too many commitments.  Techniques for coping with stress include the following:  Rethinking the problem. Try to think realistically about stressful events rather than ignoring them or overreacting. Try to find the positives in a stressful situation rather than focusing on the negatives.  Exercise. Physical exercise can release both physical and emotional tension. The key is to find a form of exercise you enjoy and do it regularly.  Relaxation techniques. These relax the body and mind. Examples include yoga, meditation, tai chi, biofeedback, deep breathing, progressive muscle relaxation, listening to music, being out in nature, journaling, and other hobbies. Again, the key is to find one or more that you enjoy and can do regularly.  Healthy lifestyle. Eat a balanced diet, get plenty of sleep, and do not smoke. Avoid using alcohol or drugs to relax.  Strong support network. Spend time with family, friends, or other people you enjoy being around.Express your feelings and talk things over with someone you trust.  Counseling or talktherapy with a mental health professional may be helpful if you are having difficulty managing stress on your own. Medicine is typically not recommended for the treatment of stress.Talk to your health care provider if you think you need medicine for symptoms of stress. Follow these instructions at home:  Keep all follow-up visits as directed by your health care provider.  Take all medicines as directed by your health care provider. Contact a health care provider  if:  Your symptoms get worse or you start having new symptoms.  You feel overwhelmed by your problems and can no longer manage them on your own. Get help right away if:  You feel like hurting yourself or someone else. This information is not intended to replace advice given to you by your health care provider. Make sure you discuss any questions you have with your health care provider. Document Released: 11/16/2000 Document Revised: 10/29/2015 Document Reviewed: 01/15/2013 Elsevier Interactive Patient Education  2017 [employer].

## 2017-02-22 LAB — COMPREHENSIVE METABOLIC PANEL
ALT: 27 IU/L (ref 0–32)
AST: 24 IU/L (ref 0–40)
Albumin/Globulin Ratio: 2 (ref 1.2–2.2)
Albumin: 4.9 g/dL (ref 3.5–5.5)
Alkaline Phosphatase: 76 IU/L (ref 39–117)
BUN/Creatinine Ratio: 14 (ref 9–23)
BUN: 10 mg/dL (ref 6–20)
Bilirubin Total: 0.4 mg/dL (ref 0.0–1.2)
CALCIUM: 10 mg/dL (ref 8.7–10.2)
CO2: 23 mmol/L (ref 20–29)
CREATININE: 0.74 mg/dL (ref 0.57–1.00)
Chloride: 101 mmol/L (ref 96–106)
GFR, EST AFRICAN AMERICAN: 126 mL/min/{1.73_m2} (ref >59)
GFR, EST NON AFRICAN AMERICAN: 109 mL/min/{1.73_m2} (ref >59)
GLOBULIN, TOTAL: 2.4 g/dL (ref 1.5–4.5)
Glucose: 92 mg/dL (ref 65–99)
Potassium: 4.3 mmol/L (ref 3.5–5.2)
Sodium: 142 mmol/L (ref 134–144)
TOTAL PROTEIN: 7.3 g/dL (ref 6.0–8.5)

## 2017-02-22 LAB — CBC
HEMATOCRIT: 42.1 % (ref 34.0–46.6)
HEMOGLOBIN: 14.6 g/dL (ref 11.1–15.9)
MCH: 29.4 pg (ref 26.6–33.0)
MCHC: 34.7 g/dL (ref 31.5–35.7)
MCV: 85 fL (ref 79–97)
Platelets: 330 10*3/uL (ref 150–379)
RBC: 4.96 x10E6/uL (ref 3.77–5.28)
RDW: 13.7 % (ref 12.3–15.4)
WBC: 11.7 10*3/uL — AB (ref 3.4–10.8)

## 2017-02-22 LAB — HIV ANTIBODY (ROUTINE TESTING W REFLEX): HIV Screen 4th Generation wRfx: NONREACTIVE

## 2017-02-22 LAB — RPR: RPR Ser Ql: NONREACTIVE

## 2017-02-23 LAB — IGP, COBASHPV16/18
HPV 16: NEGATIVE
HPV 18: NEGATIVE
HPV other hr types: NEGATIVE
PAP SMEAR COMMENT: 0

## 2017-02-24 ENCOUNTER — Telehealth: Payer: Self-pay

## 2017-02-24 ENCOUNTER — Telehealth: Payer: Self-pay | Admitting: Obstetrics and Gynecology

## 2017-02-24 DIAGNOSIS — N760 Acute vaginitis: Principal | ICD-10-CM

## 2017-02-24 DIAGNOSIS — B9689 Other specified bacterial agents as the cause of diseases classified elsewhere: Secondary | ICD-10-CM

## 2017-02-24 LAB — NUSWAB VAGINITIS PLUS (VG+)
Atopobium vaginae: HIGH Score — AB
BVAB 2: HIGH Score — AB
CHLAMYDIA TRACHOMATIS, NAA: NEGATIVE
Candida albicans, NAA: NEGATIVE
Candida glabrata, NAA: NEGATIVE
MEGASPHAERA 1: HIGH {score} — AB
Neisseria gonorrhoeae, NAA: NEGATIVE
TRICH VAG BY NAA: NEGATIVE

## 2017-02-24 MED ORDER — METRONIDAZOLE 500 MG PO TABS: 500.0000 mg | ORAL_TABLET | Freq: Two times a day (BID) | ORAL | 0 refills | Status: DC

## 2017-02-24 NOTE — Telephone Encounter (Signed)
Called pt no answer. LM for pt to call back.  

## 2017-02-24 NOTE — Telephone Encounter (Signed)
-----   Message from Rubie Maid, MD sent at 02/24/2017  9:34 AM EDT ----- Please inform patient tof negative annual labs and STD screening.  She is positive for BV, and can take Flagyl PO or Metrogel vaginally.  Also sent Mychart message, but not sure if she checks these.

## 2017-02-24 NOTE — Telephone Encounter (Signed)
Called pt. No answer. LM for pt informing her of BV and the need for treatment. RX sent in.

## 2017-02-24 NOTE — Telephone Encounter (Signed)
Patient called and stated that she would like to have a call from Nurse [facility], or Dr. Marcelline Mates when her results are in. No other information was disclosed. Please advise.

## 2017-06-06 NOTE — L&D Delivery Note (Signed)
Delivery Summary for Rise Paganini  Labor Events:   Preterm labor:   Rupture date:   Rupture time:   Rupture type:   Fluid Color:   Induction:   Augmentation:   Complications:   Cervical ripening:          Delivery:   Episiotomy:   Lacerations:   Repair suture:   Repair # of packets:   Blood loss (ml): 650   Information for the patient's newborn:  Aribelle, Mccosh [[id number]]    Delivery 02/02/2018 12:19 PM by  C-Section, Low Transverse Sex:  female Gestational Age: [redacted]w[redacted]d Delivery Clinician:   Living?:         APGARS  One minute Five minutes Ten minutes  Skin color:        Heart rate:        Grimace:        Muscle tone:        Breathing:        Totals: 9  10      Presentation/position:      Resuscitation:   Cord information:    Disposition of cord blood:     Blood gases sent?  Complications:   Placenta: Delivered:       appearance Newborn Measurements: Weight: 6 lb 4.9 oz (2860 g)  Height: 19.29"  Head circumference:    Chest circumference:    Other providers:    Additional  information: Forceps:   Vacuum:   Breech:   Observed anomalies        Please see Dr. Andreas Blower operative note for details of C-section procedure.    Rubie Maid, MD [facility]

## 2017-06-14 ENCOUNTER — Encounter: Payer: Self-pay | Admitting: Obstetrics and Gynecology

## 2017-06-14 ENCOUNTER — Ambulatory Visit (INDEPENDENT_AMBULATORY_CARE_PROVIDER_SITE_OTHER): Payer: Medicaid Other | Admitting: Obstetrics and Gynecology

## 2017-06-14 VITALS — BP 141/79 | HR 103 | Ht 65.0 in | Wt 169.6 lb

## 2017-06-14 DIAGNOSIS — Z8659 Personal history of other mental and behavioral disorders: Secondary | ICD-10-CM

## 2017-06-14 DIAGNOSIS — Z98891 History of uterine scar from previous surgery: Secondary | ICD-10-CM | POA: Diagnosis not present

## 2017-06-14 DIAGNOSIS — N912 Amenorrhea, unspecified: Secondary | ICD-10-CM

## 2017-06-14 DIAGNOSIS — N926 Irregular menstruation, unspecified: Secondary | ICD-10-CM

## 2017-06-14 LAB — POCT URINE PREGNANCY: PREG TEST UR: POSITIVE — AB

## 2017-06-14 MED ORDER — CONCEPT DHA 53.5-38-1 MG PO CAPS: 53.5000 mg | ORAL_CAPSULE | Freq: Every day | ORAL | 11 refills | Status: DC

## 2017-06-14 NOTE — Progress Notes (Signed)
   GYNECOLOGY CLINIC PROGRESS NOTE Subjective:    Heather Cross is a 32 y.o. G25P2002 female with h/o bipolar disorder  who presents for evaluation of amenorrhea. She believes she could be pregnant. Pregnancy was unplanned. Pregnancy is desired. Sexual Activity: single partner, contraception: condoms (however reports inconsistent use). Current symptoms also include: breast tenderness and positive home pregnancy test. Last period was abnormal.  Patient notes episode of bleeding x 2 in December, but bleeding lasted x 1-2 days each, was light. Last normal period was in November.  Patient's last menstrual period was 05/02/2017 (approximate).   The following portions of the patient's history were reviewed and updated as appropriate: allergies, current medications, past family history, past medical history, past social history, past surgical history and problem list.   Review of Systems Pertinent items noted in HPI and remainder of comprehensive ROS otherwise negative.     Objective:    BP (!) 141/79   Pulse (!) 103   Ht 5\' 5"  (1.651 m)   Wt 169 lb 9.6 oz (76.9 kg)   LMP 05/02/2017 (Approximate)   Breastfeeding? No   BMI 28.22 kg/m  General: alert, no distress and no acute distress    Lab Review Urine HCG: positive    Assessment:   Absence of menstruation.   Irregular menses H/o bipolar disorder Short interval between pregnancies H/o C-section x 2  Plan:   - Pregnancy Test: Positive: EDC: 02/06/2018 with EGA 6.1 weeks. Briefly discussed pre-natal care options. Pregnancy, Childbirth and the Newborn book given. Encouraged well-balanced diet, plenty of rest when needed, pre-natal vitamins daily and walking for exercise. Discussed self-help for nausea, avoiding OTC medications until consulting provider or pharmacist, other than Tylenol as needed, minimal caffeine (1-2 cups daily) and avoiding alcohol. She will schedule her initial OB visit in the next month, and have a NOB intake in 2  weeks. Feel free to call with any questions.  - Will order ultrasound to confirm dating.  - H/o bipolar disorder, currently on no meds. Patient has not been on medications in over 2 years. Will continue to monitor symptoms during the pregnancy.  - Short interval between pregnancies. Briefly discussed importance of pregnancy spacing. Patient notes that this will be her last pregnancy as she will desire BTL postpartum. Will have patient sign Medicaid papers at appropriate time.  - H/o C-section x 2, patient notes desire to have repeat C-section.   Rubie Maid, MD [facility]

## 2017-06-23 ENCOUNTER — Telehealth: Payer: Self-pay | Admitting: Obstetrics and Gynecology

## 2017-06-23 NOTE — Telephone Encounter (Signed)
Pt states that after masturbation this morning she saw blood in her underwear. Very lite. She did not insert anything in her vagina. She states she her nails  could have scratched her. No cramps, back pain, or pain with urination. Last intercourse was 3 + weeks ago. Pt is constipated.  Advised pt to monitor for now. If she develops bleeding like a period or severe cramps not relived with tylenol she will need to make an appt. Pt also wanted her appt moved sooner. Advised we see ob pts q 4 weeks.

## 2017-06-23 NOTE — Telephone Encounter (Signed)
The patient called and stated that she went to the rest room this monring and when she wiped she had a light amount of blood. The patient is concerned and would like to speak with a nurse as soon as possible. Please advise.

## 2017-07-12 ENCOUNTER — Encounter: Payer: Self-pay | Admitting: Obstetrics and Gynecology

## 2017-07-12 ENCOUNTER — Ambulatory Visit (INDEPENDENT_AMBULATORY_CARE_PROVIDER_SITE_OTHER): Payer: Medicaid Other | Admitting: Obstetrics and Gynecology

## 2017-07-12 ENCOUNTER — Other Ambulatory Visit (INDEPENDENT_AMBULATORY_CARE_PROVIDER_SITE_OTHER): Payer: Medicaid Other

## 2017-07-12 ENCOUNTER — Other Ambulatory Visit: Payer: Self-pay | Admitting: Obstetrics and Gynecology

## 2017-07-12 VITALS — BP 115/77 | HR 118 | Wt 165.8 lb

## 2017-07-12 DIAGNOSIS — Z3A10 10 weeks gestation of pregnancy: Secondary | ICD-10-CM

## 2017-07-12 LAB — POCT URINALYSIS DIPSTICK
BILIRUBIN UA: NEGATIVE
Blood, UA: NEGATIVE
Glucose, UA: NEGATIVE
Ketones, UA: NEGATIVE
Nitrite, UA: POSITIVE
PH UA: 7.5 (ref 5.0–8.0)
Protein, UA: NEGATIVE
SPEC GRAV UA: 1.015 (ref 1.010–1.025)
UROBILINOGEN UA: 0.2 U/dL

## 2017-07-12 MED ORDER — NITROFURANTOIN MONOHYD MACRO 100 MG PO CAPS: 100.0000 mg | ORAL_CAPSULE | Freq: Two times a day (BID) | ORAL | 0 refills | Status: DC

## 2017-07-12 NOTE — Progress Notes (Signed)
Pt did not have U/s or Nurse visit yet - she says not on her schedule. Denies complaints today.  - taking PNV Plan - U/S ordered today            Nurse visit scheduled            NOB visit scheduled.

## 2017-07-12 NOTE — Addendum Note (Signed)
Addended by: Keturah Barre L on: 07/12/2017 02:51 PM   Modules accepted: Orders

## 2017-07-12 NOTE — Addendum Note (Signed)
Addended by: Keturah Barre L on: 07/12/2017 02:52 PM   Modules accepted: Orders

## 2017-07-13 LAB — URINALYSIS, ROUTINE W REFLEX MICROSCOPIC
Bilirubin, UA: NEGATIVE
Glucose, UA: NEGATIVE
NITRITE UA: POSITIVE — AB
PH UA: 5.5 (ref 5.0–7.5)
Specific Gravity, UA: >=1.03 — AB (ref 1.005–1.030)
Urobilinogen, Ur: 1 mg/dL (ref 0.2–1.0)

## 2017-07-13 LAB — HIV ANTIBODY (ROUTINE TESTING W REFLEX): HIV Screen 4th Generation wRfx: NONREACTIVE

## 2017-07-13 LAB — MONITOR DRUG PROFILE 14(MW)
Amphetamine Scrn, Ur: NEGATIVE ng/mL
BARBITURATE SCREEN URINE: NEGATIVE ng/mL
BENZODIAZEPINE SCREEN, URINE: NEGATIVE ng/mL
BUPRENORPHINE, URINE: NEGATIVE ng/mL
CANNABINOIDS UR QL SCN: NEGATIVE ng/mL
COCAINE(METAB.)SCREEN, URINE: NEGATIVE ng/mL
CREATININE(CRT), U: 257.4 mg/dL (ref 20.0–300.0)
FENTANYL, URINE: NEGATIVE pg/mL
MEPERIDINE SCREEN, URINE: NEGATIVE ng/mL
METHADONE SCREEN, URINE: NEGATIVE ng/mL
OPIATE SCREEN URINE: NEGATIVE ng/mL
OXYCODONE+OXYMORPHONE UR QL SCN: NEGATIVE ng/mL
PHENCYCLIDINE QUANTITATIVE URINE: NEGATIVE ng/mL
Ph of Urine: 5.7 (ref 4.5–8.9)
Propoxyphene Scrn, Ur: NEGATIVE ng/mL
SPECIFIC GRAVITY: 1.026
TRAMADOL SCREEN, URINE: NEGATIVE ng/mL

## 2017-07-13 LAB — MICROSCOPIC EXAMINATION: Casts: NONE SEEN /lpf

## 2017-07-13 LAB — HEPATITIS B SURFACE ANTIGEN: Hepatitis B Surface Ag: NEGATIVE

## 2017-07-13 LAB — ABO AND RH: Rh Factor: POSITIVE

## 2017-07-13 LAB — CBC WITH DIFFERENTIAL/PLATELET
BASOS ABS: 0 10*3/uL (ref 0.0–0.2)
BASOS: 0 %
EOS (ABSOLUTE): 0.1 10*3/uL (ref 0.0–0.4)
Eos: 1 %
HEMATOCRIT: 41 % (ref 34.0–46.6)
Hemoglobin: 13.8 g/dL (ref 11.1–15.9)
Immature Grans (Abs): 0 10*3/uL (ref 0.0–0.1)
Immature Granulocytes: 0 %
LYMPHS ABS: 2.3 10*3/uL (ref 0.7–3.1)
Lymphs: 22 %
MCH: 29.8 pg (ref 26.6–33.0)
MCHC: 33.7 g/dL (ref 31.5–35.7)
MCV: 89 fL (ref 79–97)
MONOCYTES: 7 %
Monocytes Absolute: 0.7 10*3/uL (ref 0.1–0.9)
NEUTROS ABS: 7 10*3/uL (ref 1.4–7.0)
Neutrophils: 70 %
Platelets: 314 10*3/uL (ref 150–379)
RBC: 4.63 x10E6/uL (ref 3.77–5.28)
RDW: 13.5 % (ref 12.3–15.4)
WBC: 10.1 10*3/uL (ref 3.4–10.8)

## 2017-07-13 LAB — RPR: RPR Ser Ql: NONREACTIVE

## 2017-07-13 LAB — RUBELLA SCREEN: Rubella Antibodies, IGG: 3.22 index (ref >0.99)

## 2017-07-13 LAB — VARICELLA ZOSTER ANTIBODY, IGG: Varicella zoster IgG: 793 index (ref >165)

## 2017-07-13 LAB — ANTIBODY SCREEN: ANTIBODY SCREEN: NEGATIVE

## 2017-07-14 LAB — URINE CULTURE

## 2017-07-14 LAB — GC/CHLAMYDIA PROBE AMP
Chlamydia trachomatis, NAA: NEGATIVE
Neisseria gonorrhoeae by PCR: NEGATIVE

## 2017-07-19 ENCOUNTER — Encounter: Payer: Self-pay | Admitting: *Deleted

## 2017-07-26 ENCOUNTER — Encounter: Payer: Self-pay | Admitting: Obstetrics and Gynecology

## 2017-07-26 ENCOUNTER — Ambulatory Visit (INDEPENDENT_AMBULATORY_CARE_PROVIDER_SITE_OTHER): Payer: Medicaid Other | Admitting: Obstetrics and Gynecology

## 2017-07-26 VITALS — BP 121/78 | HR 114 | Wt 165.6 lb

## 2017-07-26 DIAGNOSIS — Z3480 Encounter for supervision of other normal pregnancy, unspecified trimester: Secondary | ICD-10-CM

## 2017-07-26 DIAGNOSIS — F419 Anxiety disorder, unspecified: Secondary | ICD-10-CM

## 2017-07-26 DIAGNOSIS — O99891 Other specified diseases and conditions complicating pregnancy: Secondary | ICD-10-CM

## 2017-07-26 DIAGNOSIS — Z98891 History of uterine scar from previous surgery: Secondary | ICD-10-CM | POA: Insufficient documentation

## 2017-07-26 DIAGNOSIS — O9989 Other specified diseases and conditions complicating pregnancy, childbirth and the puerperium: Secondary | ICD-10-CM

## 2017-07-26 DIAGNOSIS — R8271 Bacteriuria: Secondary | ICD-10-CM

## 2017-07-26 DIAGNOSIS — F329 Major depressive disorder, single episode, unspecified: Secondary | ICD-10-CM

## 2017-07-26 NOTE — Progress Notes (Signed)
Pt is doing well.
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
## 2017-08-01 ENCOUNTER — Other Ambulatory Visit: Payer: Self-pay

## 2017-08-01 ENCOUNTER — Telehealth: Payer: Self-pay

## 2017-08-01 ENCOUNTER — Telehealth: Payer: Self-pay | Admitting: Obstetrics and Gynecology

## 2017-08-01 NOTE — Telephone Encounter (Signed)
The patient called and stated that she was informed that she has a UTI and a prescription will be sent in, The patient stated that she already has that medication that is going to be sent in and would like to speak with a nurse. Please advise.

## 2017-08-01 NOTE — Telephone Encounter (Signed)
-----   Message from Harlin Heys, MD sent at 07/31/2017  8:22 AM EST ----- Results are c/w UTI - needs Rx: MacroBid 1 PO BID for 7 days

## 2017-08-01 NOTE — Telephone Encounter (Signed)
Voicemail message left for pt to return call.

## 2017-08-01 NOTE — Telephone Encounter (Signed)
Pt has picked up macrobid. Encouraged pt to complete meds.

## 2017-08-22 ENCOUNTER — Encounter: Payer: Self-pay | Admitting: Obstetrics and Gynecology

## 2017-08-22 ENCOUNTER — Ambulatory Visit (INDEPENDENT_AMBULATORY_CARE_PROVIDER_SITE_OTHER): Payer: Medicaid Other | Admitting: Obstetrics and Gynecology

## 2017-08-22 VITALS — BP 105/70 | HR 109 | Wt 164.5 lb

## 2017-08-22 DIAGNOSIS — N390 Urinary tract infection, site not specified: Secondary | ICD-10-CM

## 2017-08-22 DIAGNOSIS — Z3482 Encounter for supervision of other normal pregnancy, second trimester: Secondary | ICD-10-CM

## 2017-08-22 DIAGNOSIS — B9620 Unspecified Escherichia coli [E. coli] as the cause of diseases classified elsewhere: Secondary | ICD-10-CM

## 2017-08-22 LAB — POCT URINALYSIS DIPSTICK
Bilirubin, UA: NEGATIVE
Glucose, UA: NEGATIVE
KETONES UA: NEGATIVE
NITRITE UA: POSITIVE
ODOR: NEGATIVE
Spec Grav, UA: 1.02 (ref 1.010–1.025)
UROBILINOGEN UA: 0.2 U/dL
pH, UA: 7 (ref 5.0–8.0)

## 2017-08-22 MED ORDER — NITROFURANTOIN MONOHYD MACRO 100 MG PO CAPS: 100.0000 mg | ORAL_CAPSULE | Freq: Two times a day (BID) | ORAL | 1 refills | Status: DC

## 2017-08-22 NOTE — Progress Notes (Signed)
ROB: Patient has no complaints.  Says that she took her antibiotics as directed however continues to  have urinary nitrates. -C&S sent Macrobid prescribed.  Discussed AFP and quad screen -will draw today.  Fetal anatomic survey next visit.

## 2017-08-22 NOTE — Progress Notes (Signed)
ROB- pt did complete atb for uti. Will send repeat culture. Pt c/o of sob. Declines genetic testing.

## 2017-08-24 LAB — URINE CULTURE

## 2017-09-05 ENCOUNTER — Other Ambulatory Visit: Payer: Self-pay | Admitting: Obstetrics and Gynecology

## 2017-09-05 DIAGNOSIS — Z3689 Encounter for other specified antenatal screening: Secondary | ICD-10-CM

## 2017-09-19 ENCOUNTER — Telehealth: Payer: Self-pay

## 2017-09-19 ENCOUNTER — Ambulatory Visit (INDEPENDENT_AMBULATORY_CARE_PROVIDER_SITE_OTHER): Payer: Medicaid Other | Admitting: Obstetrics and Gynecology

## 2017-09-19 ENCOUNTER — Ambulatory Visit (INDEPENDENT_AMBULATORY_CARE_PROVIDER_SITE_OTHER): Payer: Medicaid Other

## 2017-09-19 DIAGNOSIS — Z3689 Encounter for other specified antenatal screening: Secondary | ICD-10-CM | POA: Diagnosis not present

## 2017-09-19 DIAGNOSIS — B850 Pediculosis due to Pediculus humanus capitis: Secondary | ICD-10-CM

## 2017-09-19 DIAGNOSIS — B852 Pediculosis, unspecified: Secondary | ICD-10-CM

## 2017-09-19 NOTE — Telephone Encounter (Signed)
Pt was called to talk to her about her visit in the office this morning and her having head lice. Pt stated that she is already being treated by her PCP and have an upcoming appointment for a follow up there on Thursday. Pt stated that she did not need the prescription of Nix since she already have a prescription. Pt stated that she has already washed all of her clothes and baby clothes in hot water. Pt was advised if she had any questions to please call the office.

## 2017-09-22 NOTE — Progress Notes (Signed)
Patient checked in, but not seen by MD due to lice infestation. Visit rescheduled.

## 2017-09-25 ENCOUNTER — Encounter: Payer: Self-pay | Admitting: Obstetrics and Gynecology

## 2017-09-25 ENCOUNTER — Ambulatory Visit (INDEPENDENT_AMBULATORY_CARE_PROVIDER_SITE_OTHER): Payer: Medicaid Other | Admitting: Obstetrics and Gynecology

## 2017-09-25 VITALS — BP 92/62 | HR 111 | Wt 151.3 lb

## 2017-09-25 DIAGNOSIS — Z3402 Encounter for supervision of normal first pregnancy, second trimester: Secondary | ICD-10-CM | POA: Diagnosis not present

## 2017-09-25 LAB — POCT URINALYSIS DIPSTICK
Glucose, UA: NEGATIVE
KETONES UA: NEGATIVE
NITRITE UA: NEGATIVE
ODOR: NEGATIVE
Spec Grav, UA: 1.02 (ref 1.010–1.025)
UROBILINOGEN UA: 0.2 U/dL
pH, UA: 6.5 (ref 5.0–8.0)

## 2017-09-25 NOTE — Progress Notes (Signed)
ROB- Pt states she self treated for lice x 2. Unsure if she still has lice d/t she is still itching. [facility] was able to treat her child.  Seen in urgent care for ear infection.

## 2017-09-25 NOTE — Progress Notes (Signed)
ROB: Patient treated herself for lice [id number] the children as well.  Seen in the emergency department for ruptured eardrum related to self caused.  Reports active fetal movement.

## 2017-09-27 LAB — URINE CULTURE: ORGANISM ID, BACTERIA: NO GROWTH

## 2017-10-18 ENCOUNTER — Observation Stay
Admission: EM | Admit: 2017-10-18 | Discharge: 2017-10-18 | Disposition: A | Discharge status: Home / Self Care | Payer: Medicaid Other | Attending: Obstetrics and Gynecology | Admitting: Obstetrics and Gynecology

## 2017-10-18 ENCOUNTER — Other Ambulatory Visit: Payer: Self-pay

## 2017-10-18 ENCOUNTER — Encounter: Payer: Self-pay | Admitting: Emergency Medicine

## 2017-10-18 DIAGNOSIS — Z3A24 24 weeks gestation of pregnancy: Secondary | ICD-10-CM

## 2017-10-18 DIAGNOSIS — O26892 Other specified pregnancy related conditions, second trimester: Principal | ICD-10-CM | POA: Insufficient documentation
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
## 2017-10-24 ENCOUNTER — Ambulatory Visit (INDEPENDENT_AMBULATORY_CARE_PROVIDER_SITE_OTHER): Payer: Medicaid Other | Admitting: Obstetrics and Gynecology

## 2017-10-24 VITALS — BP 96/62 | HR 97 | Wt 170.1 lb

## 2017-10-24 DIAGNOSIS — R8271 Bacteriuria: Secondary | ICD-10-CM | POA: Insufficient documentation

## 2017-10-24 DIAGNOSIS — O9989 Other specified diseases and conditions complicating pregnancy, childbirth and the puerperium: Secondary | ICD-10-CM

## 2017-10-24 DIAGNOSIS — Z3482 Encounter for supervision of other normal pregnancy, second trimester: Secondary | ICD-10-CM

## 2017-10-24 DIAGNOSIS — O99891 Other specified diseases and conditions complicating pregnancy: Secondary | ICD-10-CM | POA: Insufficient documentation

## 2017-10-24 DIAGNOSIS — Z98891 History of uterine scar from previous surgery: Secondary | ICD-10-CM

## 2017-10-24 LAB — POCT URINALYSIS DIPSTICK
BILIRUBIN UA: NEGATIVE
GLUCOSE UA: NEGATIVE
KETONES UA: NEGATIVE
Nitrite, UA: NEGATIVE
PH UA: 7.5 (ref 5.0–8.0)
Protein, UA: POSITIVE — AB
RBC UA: NEGATIVE
Spec Grav, UA: 1.01 (ref 1.010–1.025)
UROBILINOGEN UA: 0.2 U/dL
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
## 2017-10-24 NOTE — Progress Notes (Signed)
ROB- pt stated that she is doing well and have no concerns.

## 2017-11-08 ENCOUNTER — Telehealth: Payer: Self-pay | Admitting: Obstetrics and Gynecology

## 2017-11-08 NOTE — Telephone Encounter (Signed)
Pt advised to use rid for lice. Be sure her family and household is treated. F/u with provider at [facility].

## 2017-11-08 NOTE — Telephone Encounter (Signed)
The patient wants to know what other treatment she can use to get rid of the nits and lice.  Please advise.  Thank you.

## 2017-11-14 ENCOUNTER — Ambulatory Visit (INDEPENDENT_AMBULATORY_CARE_PROVIDER_SITE_OTHER): Payer: Medicaid Other | Admitting: Obstetrics and Gynecology

## 2017-11-14 ENCOUNTER — Other Ambulatory Visit: Payer: Medicaid Other

## 2017-11-14 ENCOUNTER — Encounter: Payer: Self-pay | Admitting: Obstetrics and Gynecology

## 2017-11-14 VITALS — BP 112/74 | HR 132 | Wt 172.8 lb

## 2017-11-14 DIAGNOSIS — Z3482 Encounter for supervision of other normal pregnancy, second trimester: Secondary | ICD-10-CM

## 2017-11-14 DIAGNOSIS — R460 Very low level of personal hygiene: Secondary | ICD-10-CM

## 2017-11-14 DIAGNOSIS — B852 Pediculosis, unspecified: Secondary | ICD-10-CM | POA: Insufficient documentation

## 2017-11-14 DIAGNOSIS — B850 Pediculosis due to Pediculus humanus capitis: Secondary | ICD-10-CM | POA: Insufficient documentation

## 2017-11-14 DIAGNOSIS — Z131 Encounter for screening for diabetes mellitus: Secondary | ICD-10-CM

## 2017-11-14 DIAGNOSIS — Z23 Encounter for immunization: Secondary | ICD-10-CM

## 2017-11-14 DIAGNOSIS — Z130 Encounter for screening for diseases of the blood and blood-forming organs and certain disorders involving the immune mechanism: Secondary | ICD-10-CM

## 2017-11-14 LAB — POCT URINALYSIS DIPSTICK
Bilirubin, UA: NEGATIVE
Blood, UA: NEGATIVE
GLUCOSE UA: NEGATIVE
Nitrite, UA: NEGATIVE
ODOR: NEGATIVE
Protein, UA: POSITIVE — AB
Spec Grav, UA: 1.015 (ref 1.010–1.025)
Urobilinogen, UA: 0.2 E.U./dL
pH, UA: 7 (ref 5.0–8.0)

## 2017-11-14 MED ORDER — TETANUS-DIPHTH-ACELL PERTUSSIS 5-2.5-18.5 LF-MCG/0.5 IM SUSP: 0.5000 mL | Freq: Once | INTRAMUSCULAR | Status: DC

## 2017-11-14 NOTE — Progress Notes (Signed)
ROB- gtt and btc -done. Declines tdap.

## 2017-11-14 NOTE — Progress Notes (Signed)
ROB: Patient without complaints.  Refusing Tdap.  1 hour GCT today.

## 2017-11-15 LAB — CBC
Hematocrit: 36.2 % (ref 34.0–46.6)
Hemoglobin: 12.1 g/dL (ref 11.1–15.9)
MCH: 28.8 pg (ref 26.6–33.0)
MCHC: 33.4 g/dL (ref 31.5–35.7)
MCV: 86 fL (ref 79–97)
Platelets: 285 10*3/uL (ref 150–450)
RBC: 4.2 x10E6/uL (ref 3.77–5.28)
RDW: 13.6 % (ref 12.3–15.4)
WBC: 10.3 10*3/uL (ref 3.4–10.8)

## 2017-11-15 LAB — GLUCOSE, 1 HOUR GESTATIONAL: Gestational Diabetes Screen: 134 mg/dL (ref 65–139)

## 2017-12-06 ENCOUNTER — Ambulatory Visit (INDEPENDENT_AMBULATORY_CARE_PROVIDER_SITE_OTHER): Payer: Medicaid Other | Admitting: Obstetrics and Gynecology

## 2017-12-06 VITALS — BP 103/67 | HR 101 | Wt 172.2 lb

## 2017-12-06 DIAGNOSIS — R460 Very low level of personal hygiene: Secondary | ICD-10-CM | POA: Insufficient documentation

## 2017-12-06 DIAGNOSIS — Z98891 History of uterine scar from previous surgery: Secondary | ICD-10-CM

## 2017-12-06 DIAGNOSIS — Z3483 Encounter for supervision of other normal pregnancy, third trimester: Secondary | ICD-10-CM

## 2017-12-06 DIAGNOSIS — Z113 Encounter for screening for infections with a predominantly sexual mode of transmission: Secondary | ICD-10-CM

## 2017-12-06 DIAGNOSIS — O34219 Maternal care for unspecified type scar from previous cesarean delivery: Secondary | ICD-10-CM

## 2017-12-06 LAB — POCT URINALYSIS DIPSTICK
BILIRUBIN UA: NEGATIVE
Glucose, UA: NEGATIVE
Ketones, UA: NEGATIVE
Nitrite, UA: NEGATIVE
PH UA: 7.5 (ref 5.0–8.0)
Protein, UA: POSITIVE — AB
RBC UA: NEGATIVE
SPEC GRAV UA: 1.01 (ref 1.010–1.025)
UROBILINOGEN UA: 0.2 U/dL

## 2017-12-06 NOTE — Progress Notes (Signed)
ROB: Patient doing well, no complaints. Reviewed 28 week labs, normal. RPR missing, will order for future visit (patient declines to have performed today due to schedule conflict). Previously declined Tdap. Blood consent signed. Desires BTL for contraception, to be performed at time of C-section.  Discussed all options, patient still desires BTL. Medicaid paper signed today. RTC in 2 weeks.

## 2017-12-06 NOTE — Progress Notes (Signed)
ROB-pt stated that she is doing well no complaints.

## 2017-12-10 ENCOUNTER — Encounter: Payer: Self-pay | Admitting: Emergency Medicine

## 2017-12-10 ENCOUNTER — Emergency Department: Payer: Medicaid Other

## 2017-12-10 ENCOUNTER — Emergency Department
Admission: EM | Admit: 2017-12-10 | Discharge: 2017-12-10 | Disposition: A | Discharge status: Home / Self Care | Payer: Medicaid Other | Attending: Emergency Medicine | Admitting: Emergency Medicine

## 2017-12-10 ENCOUNTER — Other Ambulatory Visit: Payer: Self-pay

## 2017-12-10 DIAGNOSIS — Z3A31 31 weeks gestation of pregnancy: Secondary | ICD-10-CM | POA: Insufficient documentation

## 2017-12-10 DIAGNOSIS — O219 Vomiting of pregnancy, unspecified: Secondary | ICD-10-CM | POA: Insufficient documentation

## 2017-12-10 DIAGNOSIS — Z87891 Personal history of nicotine dependence: Secondary | ICD-10-CM | POA: Insufficient documentation

## 2017-12-10 DIAGNOSIS — O2340 Unspecified infection of urinary tract in pregnancy, unspecified trimester: Secondary | ICD-10-CM | POA: Diagnosis not present

## 2017-12-10 DIAGNOSIS — N390 Urinary tract infection, site not specified: Secondary | ICD-10-CM

## 2017-12-10 DIAGNOSIS — R112 Nausea with vomiting, unspecified: Secondary | ICD-10-CM

## 2017-12-10 LAB — CBC
HEMATOCRIT: 41.3 % (ref 35.0–47.0)
HEMOGLOBIN: 13.9 g/dL (ref 12.0–16.0)
MCH: 28.7 pg (ref 26.0–34.0)
MCHC: 33.6 g/dL (ref 32.0–36.0)
MCV: 85.5 fL (ref 80.0–100.0)
Platelets: 275 10*3/uL (ref 150–440)
RBC: 4.84 MIL/uL (ref 3.80–5.20)
RDW: 14.3 % (ref 11.5–14.5)
WBC: 21.1 10*3/uL — ABNORMAL HIGH (ref 3.6–11.0)

## 2017-12-10 LAB — DIFFERENTIAL
BASOS PCT: 0 %
Basophils Absolute: 0.1 10*3/uL (ref 0–0.1)
EOS PCT: 1 %
Eosinophils Absolute: 0.1 10*3/uL (ref 0–0.7)
LYMPHS PCT: 10 %
Lymphs Abs: 2.1 10*3/uL (ref 1.0–3.6)
MONO ABS: 1.2 10*3/uL — AB (ref 0.2–0.9)
Monocytes Relative: 6 %
Neutro Abs: 17.2 10*3/uL — ABNORMAL HIGH (ref 1.4–6.5)
Neutrophils Relative %: 83 %

## 2017-12-10 LAB — URINALYSIS, COMPLETE (UACMP) WITH MICROSCOPIC
Bilirubin Urine: NEGATIVE
GLUCOSE, UA: NEGATIVE mg/dL
HGB URINE DIPSTICK: NEGATIVE
Ketones, ur: 20 mg/dL — AB
NITRITE: NEGATIVE
PROTEIN: 30 mg/dL — AB
Specific Gravity, Urine: 1.027 (ref 1.005–1.030)
pH: 5 (ref 5.0–8.0)

## 2017-12-10 LAB — COMPREHENSIVE METABOLIC PANEL
ALBUMIN: 3.5 g/dL (ref 3.5–5.0)
ALT: 9 U/L (ref 0–44)
ANION GAP: 10 (ref 5–15)
AST: 15 U/L (ref 15–41)
Alkaline Phosphatase: 95 U/L (ref 38–126)
BUN: 9 mg/dL (ref 6–20)
CHLORIDE: 108 mmol/L (ref 98–111)
CO2: 19 mmol/L — AB (ref 22–32)
Calcium: 9.1 mg/dL (ref 8.9–10.3)
Creatinine, Ser: 0.31 mg/dL — ABNORMAL LOW (ref 0.44–1.00)
GFR calc Af Amer: >60 mL/min (ref >60)
GFR calc non Af Amer: >60 mL/min (ref >60)
GLUCOSE: 106 mg/dL — AB (ref 70–99)
POTASSIUM: 3.8 mmol/L (ref 3.5–5.1)
SODIUM: 137 mmol/L (ref 135–145)
Total Bilirubin: 0.6 mg/dL (ref 0.3–1.2)
Total Protein: 7.5 g/dL (ref 6.5–8.1)

## 2017-12-10 LAB — LIPASE, BLOOD: LIPASE: 30 U/L (ref 11–51)

## 2017-12-10 MED ORDER — SODIUM CHLORIDE 0.9 % IV BOLUS
1000.0000 mL | Freq: Once | INTRAVENOUS | Status: AC
  Administered 2017-12-10: 1000 mL via INTRAVENOUS

## 2017-12-10 MED ORDER — CEPHALEXIN 500 MG PO CAPS
500.0000 mg | ORAL_CAPSULE | Freq: Once | ORAL | Status: AC
  Administered 2017-12-10: 500 mg via ORAL
  Filled 2017-12-10: qty 1

## 2017-12-10 MED ORDER — CEPHALEXIN 500 MG PO CAPS: 500.0000 mg | ORAL_CAPSULE | Freq: Three times a day (TID) | ORAL | 0 refills | Status: DC

## 2017-12-10 MED ORDER — METOCLOPRAMIDE HCL 5 MG/ML IJ SOLN
10.0000 mg | Freq: Once | INTRAMUSCULAR | Status: AC
  Administered 2017-12-10: 10 mg via INTRAVENOUS
  Filled 2017-12-10: qty 2

## 2017-12-10 NOTE — ED Triage Notes (Addendum)
Pt c/o vomiting that started this afternoon, pt states she vomited multiple times and is not vomiting bile.  Pt denies any abdominal pain, fluid leaking, contractions or any issues with baby.  Pt sees Dr. Marcelline Mates. Pt is 31 weeks. G-3 P-2  Due date is 02/06/18

## 2017-12-10 NOTE — ED Notes (Signed)
Patient transported to Ultrasound 

## 2017-12-10 NOTE — ED Triage Notes (Signed)
First nurse note: pt denies contractions, bleeding, leaking fluid, states that she has been vomiting and can't stop
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
## 2017-12-10 NOTE — Discharge Instructions (Addendum)
Please seek medical attention for any high fevers, chest pain, shortness of breath, change in behavior, persistent vomiting, bloody stool or any other new or concerning symptoms.  

## 2017-12-10 NOTE — ED Notes (Signed)
Pt returned from US

## 2017-12-10 NOTE — ED Notes (Signed)
Pt states that nausea has improved.

## 2017-12-12 ENCOUNTER — Telehealth: Payer: Self-pay | Admitting: Obstetrics and Gynecology

## 2017-12-12 LAB — URINE CULTURE

## 2017-12-12 NOTE — Telephone Encounter (Signed)
Pls review ed notes. Pts next visit is 7/17. Do you want to see her sooner? ty

## 2017-12-12 NOTE — Telephone Encounter (Signed)
The patient was in the hospital on Sunday with emesis; and was told to check with out office to see if she needs to be seen, please advise, thanks.

## 2017-12-13 NOTE — Telephone Encounter (Signed)
Pt aware.

## 2017-12-20 ENCOUNTER — Ambulatory Visit (INDEPENDENT_AMBULATORY_CARE_PROVIDER_SITE_OTHER): Payer: Medicaid Other | Admitting: Obstetrics and Gynecology

## 2017-12-20 ENCOUNTER — Encounter: Payer: Self-pay | Admitting: Obstetrics and Gynecology

## 2017-12-20 VITALS — BP 112/72 | HR 120 | Wt 167.8 lb

## 2017-12-20 DIAGNOSIS — Z3A32 32 weeks gestation of pregnancy: Secondary | ICD-10-CM

## 2017-12-20 DIAGNOSIS — R460 Very low level of personal hygiene: Secondary | ICD-10-CM

## 2017-12-20 DIAGNOSIS — Z3483 Encounter for supervision of other normal pregnancy, third trimester: Secondary | ICD-10-CM

## 2017-12-20 NOTE — Progress Notes (Signed)
Pt stated she visited the ER Sunday 12/19/17 for uncontrollable vomiting, was diagnosed with UTI

## 2017-12-20 NOTE — Progress Notes (Signed)
ROB: Denies complaints-states she took all of her antibiotics.  No further nausea and vomiting.  Needs GC/CT-GBS next visit.  (Unable to urinate today)

## 2018-01-01 ENCOUNTER — Encounter: Payer: Medicaid Other | Admitting: Obstetrics and Gynecology

## 2018-01-01 ENCOUNTER — Other Ambulatory Visit (HOSPITAL_COMMUNITY)
Admission: RE | Admit: 2018-01-01 | Discharge: 2018-01-01 | Disposition: A | Discharge status: Home / Self Care | Payer: Medicaid Other | Source: Ambulatory Visit | Attending: Obstetrics and Gynecology | Admitting: Obstetrics and Gynecology

## 2018-01-01 ENCOUNTER — Ambulatory Visit (INDEPENDENT_AMBULATORY_CARE_PROVIDER_SITE_OTHER): Payer: Medicaid Other | Admitting: Obstetrics and Gynecology

## 2018-01-01 VITALS — BP 95/61 | HR 112 | Wt 173.1 lb

## 2018-01-01 DIAGNOSIS — Z3483 Encounter for supervision of other normal pregnancy, third trimester: Secondary | ICD-10-CM

## 2018-01-01 DIAGNOSIS — Z3A34 34 weeks gestation of pregnancy: Secondary | ICD-10-CM

## 2018-01-01 DIAGNOSIS — Z3685 Encounter for antenatal screening for Streptococcus B: Secondary | ICD-10-CM

## 2018-01-01 DIAGNOSIS — Z113 Encounter for screening for infections with a predominantly sexual mode of transmission: Secondary | ICD-10-CM

## 2018-01-01 DIAGNOSIS — O2690 Pregnancy related conditions, unspecified, unspecified trimester: Secondary | ICD-10-CM

## 2018-01-01 DIAGNOSIS — O34219 Maternal care for unspecified type scar from previous cesarean delivery: Secondary | ICD-10-CM

## 2018-01-01 LAB — POCT URINALYSIS DIPSTICK
BILIRUBIN UA: NEGATIVE
Blood, UA: NEGATIVE
Glucose, UA: NEGATIVE
KETONES UA: NEGATIVE
Nitrite, UA: NEGATIVE
Protein, UA: POSITIVE — AB
Spec Grav, UA: 1.01 (ref 1.010–1.025)
UROBILINOGEN UA: 0.2 U/dL
pH, UA: 8 (ref 5.0–8.0)

## 2018-01-01 NOTE — Addendum Note (Signed)
Addended by: Edwyna Shell on: 01/01/2018 04:10 PM   Modules accepted: Orders

## 2018-01-01 NOTE — Progress Notes (Signed)
ROB-pt stated that she is doing well.

## 2018-01-01 NOTE — Progress Notes (Signed)
ROB: Patient doing well overall, no major complaints. 3rd trimester cultures done today. Scheduled C/S wit BTL on 8/27. RTC in 2 weeks.

## 2018-01-03 LAB — STREP GP B NAA: STREP GROUP B AG: NEGATIVE

## 2018-01-03 LAB — GC/CHLAMYDIA PROBE AMP (~~LOC~~) NOT AT ARMC
CHLAMYDIA, DNA PROBE: NEGATIVE
Neisseria Gonorrhea: NEGATIVE

## 2018-01-16 ENCOUNTER — Encounter: Payer: Medicaid Other | Admitting: Obstetrics and Gynecology

## 2018-01-16 ENCOUNTER — Ambulatory Visit (INDEPENDENT_AMBULATORY_CARE_PROVIDER_SITE_OTHER): Payer: Medicaid Other | Admitting: Obstetrics and Gynecology

## 2018-01-16 ENCOUNTER — Encounter: Payer: Self-pay | Admitting: Obstetrics and Gynecology

## 2018-01-16 VITALS — BP 96/67 | HR 93 | Wt 175.0 lb

## 2018-01-16 DIAGNOSIS — Z3483 Encounter for supervision of other normal pregnancy, third trimester: Secondary | ICD-10-CM

## 2018-01-16 LAB — POCT URINALYSIS DIPSTICK
BILIRUBIN UA: NEGATIVE
Blood, UA: NEGATIVE
GLUCOSE UA: NEGATIVE
KETONES UA: NEGATIVE
Nitrite, UA: NEGATIVE
Protein, UA: NEGATIVE
Spec Grav, UA: 1.01 (ref 1.010–1.025)
Urobilinogen, UA: 0.2 E.U./dL
pH, UA: 7.5 (ref 5.0–8.0)

## 2018-01-16 NOTE — Progress Notes (Signed)
Pt states feeling a lot of pressure in pelvic area.

## 2018-01-16 NOTE — Progress Notes (Signed)
ROB: Occasional contractions.  Signs and symptoms of labor discussed.  Follow-up 1 week

## 2018-01-23 ENCOUNTER — Ambulatory Visit (INDEPENDENT_AMBULATORY_CARE_PROVIDER_SITE_OTHER): Payer: Medicaid Other | Admitting: Obstetrics and Gynecology

## 2018-01-23 VITALS — BP 94/63 | HR 94 | Wt 176.5 lb

## 2018-01-23 DIAGNOSIS — Z98891 History of uterine scar from previous surgery: Secondary | ICD-10-CM

## 2018-01-23 DIAGNOSIS — Z3483 Encounter for supervision of other normal pregnancy, third trimester: Secondary | ICD-10-CM

## 2018-01-23 LAB — POCT URINALYSIS DIPSTICK OB
Bilirubin, UA: NEGATIVE
Glucose, UA: NEGATIVE — AB
Ketones, UA: NEGATIVE
NITRITE UA: NEGATIVE
PROTEIN: NEGATIVE
Spec Grav, UA: 1.015 (ref 1.010–1.025)
Urobilinogen, UA: 0.2 E.U./dL
pH, UA: 6.5 (ref 5.0–8.0)

## 2018-01-23 NOTE — Progress Notes (Signed)
ROB- PT stated that she is doing well. No complaints.

## 2018-01-23 NOTE — Progress Notes (Signed)
ROB: Doing well, no complaints. Declines cervical check. For C-section with BTL next week, pre-op done today.

## 2018-02-01 ENCOUNTER — Encounter
Admission: RE | Admit: 2018-02-01 | Discharge: 2018-02-01 | Disposition: A | Discharge status: Home / Self Care | Payer: Medicaid Other | Source: Ambulatory Visit | Attending: Obstetrics and Gynecology | Admitting: Obstetrics and Gynecology

## 2018-02-01 ENCOUNTER — Other Ambulatory Visit: Payer: Self-pay

## 2018-02-01 LAB — CBC
HEMATOCRIT: 37.7 % (ref 35.0–47.0)
HEMOGLOBIN: 13 g/dL (ref 12.0–16.0)
MCH: 29.2 pg (ref 26.0–34.0)
MCHC: 34.6 g/dL (ref 32.0–36.0)
MCV: 84.4 fL (ref 80.0–100.0)
Platelets: 246 10*3/uL (ref 150–440)
RBC: 4.47 MIL/uL (ref 3.80–5.20)
RDW: 15.3 % — ABNORMAL HIGH (ref 11.5–14.5)
WBC: 9.8 10*3/uL (ref 3.6–11.0)

## 2018-02-01 LAB — TYPE AND SCREEN
ABO/RH(D): A POS
ANTIBODY SCREEN: NEGATIVE
EXTEND SAMPLE REASON: UNDETERMINED

## 2018-02-01 LAB — RAPID HIV SCREEN (HIV 1/2 AB+AG)
HIV 1/2 Antibodies: NONREACTIVE
HIV-1 P24 ANTIGEN - HIV24: NONREACTIVE

## 2018-02-01 MED ORDER — CEFAZOLIN SODIUM-DEXTROSE 2-4 GM/100ML-% IV SOLN
2.0000 g | INTRAVENOUS | Status: AC
  Administered 2018-02-02: 2 g via INTRAVENOUS
  Filled 2018-02-01: qty 100

## 2018-02-01 NOTE — Patient Instructions (Signed)
Your procedure is scheduled on: Tomorrow Report to THE [facility] 3RD FLOOR, ENTER THRU VISITOR ENTRANCE AT 9:30 AM. To find out.  Remember: Instructions that are not followed completely may result in serious medical risk, up to and including death, or upon the discretion of your surgeon and anesthesiologist your surgery may need to be rescheduled.     _X__ 1. Do not eat food after midnight the night before your procedure.                 No gum chewing or hard candies. You may drink clear liquids up to 2 hours                 before you are scheduled to arrive for your surgery- DO not drink clear                 liquids within 2 hours of the start of your surgery.                 Clear Liquids include:  water, apple juice without pulp, clear carbohydrate                 drink such as Clearfast or Gatorade, Black Coffee or Tea (Do not add                 anything to coffee or tea).  __X__2.  On the morning of surgery brush your teeth with toothpaste and water, you                 may rinse your mouth with mouthwash if you wish.  Do not swallow any              toothpaste of mouthwash.     _X__ 3.  No Alcohol for 24 hours before or after surgery.   _X__ 4.  Do Not Smoke or use e-cigarettes For 24 Hours Prior to Your Surgery.                 Do not use any chewable tobacco products for at least 6 hours prior to                 surgery.  ____  5.  Bring all medications with you on the day of surgery if instructed.   __X__  6.  Notify your doctor if there is any change in your medical condition      (cold, fever, infections).     Do not wear jewelry, make-up, hairpins, clips or nail polish. Do not wear lotions, powders, or perfumes.  Do not shave 48 hours prior to surgery. Men may shave face and neck. Do not bring valuables to the hospital.    [facility] is not responsible for any belongings or valuables.  Contacts, dentures/partials or body piercings may not be worn into surgery.  Bring a case for your contacts, glasses or hearing aids, a denture cup will be supplied. Leave your suitcase in the car. After surgery it may be brought to your room. For patients admitted to the hospital, discharge time is determined by your treatment team.   Patients discharged the day of surgery will not be allowed to drive home.   Please read over the following fact sheets that you were given:   MRSA Information  __X__ Take these medicines the morning of surgery with A SIP OF WATER:    1. NONE  2.   3.   4.  5.  6.  ____ Fleet Enema (as directed)   __X__ Use CHG Soap/SAGE wipes as directed  ____ Use inhalers on the day of surgery  ____ Stop metformin/Janumet/Farxiga 2 days prior to surgery    ____ Take 1/2 of usual insulin dose the night before surgery. No insulin the morning          of surgery.   ____ Stop Blood Thinners Coumadin/Plavix/Xarelto/Pleta/Pradaxa/Eliquis/Effient/Aspirin  on   Or contact your Surgeon, Cardiologist or Medical Doctor regarding  ability to stop your blood thinners  __X__ Stop Anti-inflammatories 7 days before surgery such as Advil, Ibuprofen, Motrin,  BC or Goodies Powder, Naprosyn, Naproxen, Aleve, Aspirin    __X__ Stop all herbal supplements, fish oil or vitamin E until after surgery.    ____ Bring C-Pap to the hospital.

## 2018-02-02 ENCOUNTER — Encounter
Admission: RE | Disposition: A | Discharge status: Home / Self Care | Payer: Self-pay | Source: Home / Self Care | Attending: Obstetrics and Gynecology

## 2018-02-02 ENCOUNTER — Inpatient Hospital Stay: Payer: Medicaid Other | Admitting: Anesthesiology

## 2018-02-02 ENCOUNTER — Inpatient Hospital Stay
Admission: RE | Admit: 2018-02-02 | Discharge: 2018-02-04 | DRG: 785 | Disposition: A | Discharge status: Home / Self Care | Payer: Medicaid Other | Attending: Obstetrics and Gynecology | Admitting: Obstetrics and Gynecology

## 2018-02-02 DIAGNOSIS — O34211 Maternal care for low transverse scar from previous cesarean delivery: Secondary | ICD-10-CM | POA: Diagnosis present

## 2018-02-02 DIAGNOSIS — F32A Depression, unspecified: Secondary | ICD-10-CM | POA: Diagnosis present

## 2018-02-02 DIAGNOSIS — O3413 Maternal care for benign tumor of corpus uteri, third trimester: Secondary | ICD-10-CM | POA: Diagnosis present

## 2018-02-02 DIAGNOSIS — Z302 Encounter for sterilization: Secondary | ICD-10-CM

## 2018-02-02 DIAGNOSIS — D259 Leiomyoma of uterus, unspecified: Secondary | ICD-10-CM | POA: Diagnosis present

## 2018-02-02 DIAGNOSIS — O99344 Other mental disorders complicating childbirth: Secondary | ICD-10-CM | POA: Diagnosis present

## 2018-02-02 DIAGNOSIS — Z98891 History of uterine scar from previous surgery: Secondary | ICD-10-CM

## 2018-02-02 DIAGNOSIS — Z87891 Personal history of nicotine dependence: Secondary | ICD-10-CM | POA: Diagnosis not present

## 2018-02-02 DIAGNOSIS — F329 Major depressive disorder, single episode, unspecified: Secondary | ICD-10-CM | POA: Diagnosis present

## 2018-02-02 DIAGNOSIS — Z3A38 38 weeks gestation of pregnancy: Secondary | ICD-10-CM

## 2018-02-02 DIAGNOSIS — F419 Anxiety disorder, unspecified: Secondary | ICD-10-CM | POA: Diagnosis present

## 2018-02-02 DIAGNOSIS — Z3A39 39 weeks gestation of pregnancy: Secondary | ICD-10-CM

## 2018-02-02 LAB — RPR: RPR: NONREACTIVE

## 2018-02-02 SURGERY — Surgical Case
Anesthesia: Spinal | Laterality: Bilateral | Wound class: Clean Contaminated

## 2018-02-02 MED ORDER — DIPHENHYDRAMINE HCL 50 MG/ML IJ SOLN
INTRAMUSCULAR | Status: DC | PRN
  Administered 2018-02-02 (×2): 10 mg via INTRAVENOUS

## 2018-02-02 MED ORDER — LIDOCAINE 5 % EX PTCH
1.0000 | MEDICATED_PATCH | CUTANEOUS | Status: DC
  Administered 2018-02-03 – 2018-02-04 (×2): 1 via TRANSDERMAL
  Filled 2018-02-02 (×2): qty 1

## 2018-02-02 MED ORDER — OXYCODONE-ACETAMINOPHEN 5-325 MG PO TABS
1.0000 | ORAL_TABLET | ORAL | Status: DC | PRN
  Administered 2018-02-03: 1 via ORAL
  Filled 2018-02-02: qty 1

## 2018-02-02 MED ORDER — OXYTOCIN 40 UNITS IN LACTATED RINGERS INFUSION - SIMPLE MED
INTRAVENOUS | Status: AC
  Filled 2018-02-02: qty 1000

## 2018-02-02 MED ORDER — MORPHINE SULFATE (PF) 2 MG/ML IV SOLN: 1.0000 mg | INTRAVENOUS | Status: AC | PRN

## 2018-02-02 MED ORDER — MAGNESIUM HYDROXIDE 400 MG/5ML PO SUSP: 30.0000 mL | ORAL | Status: DC | PRN

## 2018-02-02 MED ORDER — ONDANSETRON HCL 4 MG/2ML IJ SOLN: 4.0000 mg | Freq: Once | INTRAMUSCULAR | Status: DC | PRN

## 2018-02-02 MED ORDER — MENTHOL 3 MG MT LOZG
1.0000 | LOZENGE | OROMUCOSAL | Status: DC | PRN
  Filled 2018-02-02: qty 9

## 2018-02-02 MED ORDER — ONDANSETRON HCL 4 MG/2ML IJ SOLN
INTRAMUSCULAR | Status: AC
  Filled 2018-02-02: qty 2

## 2018-02-02 MED ORDER — LIDOCAINE 5 % EX PTCH
1.0000 | MEDICATED_PATCH | CUTANEOUS | Status: DC
  Filled 2018-02-02: qty 1

## 2018-02-02 MED ORDER — OXYCODONE-ACETAMINOPHEN 5-325 MG PO TABS: 2.0000 | ORAL_TABLET | ORAL | Status: DC | PRN

## 2018-02-02 MED ORDER — SENNOSIDES-DOCUSATE SODIUM 8.6-50 MG PO TABS
2.0000 | ORAL_TABLET | ORAL | Status: DC
  Administered 2018-02-02 – 2018-02-03 (×2): 2 via ORAL
  Filled 2018-02-02 (×2): qty 2

## 2018-02-02 MED ORDER — DIPHENHYDRAMINE HCL 25 MG PO CAPS: 25.0000 mg | ORAL_CAPSULE | Freq: Four times a day (QID) | ORAL | Status: DC | PRN

## 2018-02-02 MED ORDER — ACETAMINOPHEN 10 MG/ML IV SOLN
INTRAVENOUS | Status: AC
  Filled 2018-02-02: qty 100

## 2018-02-02 MED ORDER — DEXAMETHASONE SODIUM PHOSPHATE 10 MG/ML IJ SOLN
INTRAMUSCULAR | Status: DC | PRN
  Administered 2018-02-02: 10 mg via INTRAVENOUS

## 2018-02-02 MED ORDER — FENTANYL CITRATE (PF) 100 MCG/2ML IJ SOLN
INTRAMUSCULAR | Status: AC
  Filled 2018-02-02: qty 2

## 2018-02-02 MED ORDER — ZOLPIDEM TARTRATE 5 MG PO TABS: 5.0000 mg | ORAL_TABLET | Freq: Every evening | ORAL | Status: DC | PRN

## 2018-02-02 MED ORDER — LIDOCAINE HCL (PF) 1 % IJ SOLN
INTRAMUSCULAR | Status: DC | PRN
  Administered 2018-02-02: 3 mL via SUBCUTANEOUS

## 2018-02-02 MED ORDER — TETRACAINE HCL 1 % IJ SOLN
INTRAMUSCULAR | Status: DC | PRN
  Administered 2018-02-02: 1.5 mg via INTRASPINAL

## 2018-02-02 MED ORDER — BUPIVACAINE IN DEXTROSE 0.75-8.25 % IT SOLN
INTRATHECAL | Status: DC | PRN
  Administered 2018-02-02: 1.75 mL via INTRATHECAL

## 2018-02-02 MED ORDER — LACTATED RINGERS IV SOLN
Freq: Once | INTRAVENOUS | Status: AC
  Administered 2018-02-02: 10:00:00 via INTRAVENOUS

## 2018-02-02 MED ORDER — DIBUCAINE 1 % RE OINT: 1.0000 "application " | TOPICAL_OINTMENT | RECTAL | Status: DC | PRN

## 2018-02-02 MED ORDER — DIPHENHYDRAMINE HCL 50 MG/ML IJ SOLN
INTRAMUSCULAR | Status: AC
  Filled 2018-02-02: qty 1

## 2018-02-02 MED ORDER — ACETAMINOPHEN 325 MG PO TABS: 650.0000 mg | ORAL_TABLET | ORAL | Status: DC | PRN

## 2018-02-02 MED ORDER — LACTATED RINGERS IV SOLN
INTRAVENOUS | Status: DC
  Administered 2018-02-03: 06:00:00 via INTRAVENOUS

## 2018-02-02 MED ORDER — ACETAMINOPHEN 10 MG/ML IV SOLN
INTRAVENOUS | Status: DC | PRN
  Administered 2018-02-02: 1000 mg via INTRAVENOUS

## 2018-02-02 MED ORDER — SIMETHICONE 80 MG PO CHEW: 80.0000 mg | CHEWABLE_TABLET | ORAL | Status: DC | PRN

## 2018-02-02 MED ORDER — SOD CITRATE-CITRIC ACID 500-334 MG/5ML PO SOLN
30.0000 mL | ORAL | Status: AC
  Administered 2018-02-02: 30 mL via ORAL
  Filled 2018-02-02: qty 15

## 2018-02-02 MED ORDER — SIMETHICONE 80 MG PO CHEW
80.0000 mg | CHEWABLE_TABLET | ORAL | Status: DC
  Administered 2018-02-02 – 2018-02-03 (×2): 80 mg via ORAL
  Filled 2018-02-02 (×2): qty 1

## 2018-02-02 MED ORDER — MORPHINE SULFATE (PF) 0.5 MG/ML IJ SOLN
INTRAMUSCULAR | Status: AC
  Filled 2018-02-02: qty 10

## 2018-02-02 MED ORDER — WITCH HAZEL-GLYCERIN EX PADS: 1.0000 "application " | MEDICATED_PAD | CUTANEOUS | Status: DC | PRN

## 2018-02-02 MED ORDER — COCONUT OIL OIL: 1.0000 "application " | TOPICAL_OIL | Status: DC | PRN

## 2018-02-02 MED ORDER — OXYTOCIN 40 UNITS IN LACTATED RINGERS INFUSION - SIMPLE MED
2.5000 [IU]/h | INTRAVENOUS | Status: AC
  Administered 2018-02-02: 2.5 [IU]/h via INTRAVENOUS

## 2018-02-02 MED ORDER — FENTANYL CITRATE (PF) 100 MCG/2ML IJ SOLN
INTRAMUSCULAR | Status: DC | PRN
  Administered 2018-02-02: 15 ug via INTRATHECAL
  Administered 2018-02-02: 50 ug via INTRAVENOUS
  Administered 2018-02-02: 35 ug via INTRAVENOUS

## 2018-02-02 MED ORDER — MORPHINE SULFATE (PF) 0.5 MG/ML IJ SOLN
INTRAMUSCULAR | Status: DC | PRN
  Administered 2018-02-02: .1 mg via INTRATHECAL
  Administered 2018-02-02: .9 mg via INTRAVENOUS
  Administered 2018-02-02: 2 mg via EPIDURAL

## 2018-02-02 MED ORDER — FENTANYL CITRATE (PF) 100 MCG/2ML IJ SOLN: 25.0000 ug | INTRAMUSCULAR | Status: DC | PRN

## 2018-02-02 MED ORDER — ONDANSETRON HCL 4 MG/2ML IJ SOLN
INTRAMUSCULAR | Status: DC | PRN
  Administered 2018-02-02: 4 mg via INTRAVENOUS

## 2018-02-02 MED ORDER — OXYTOCIN 40 UNITS IN LACTATED RINGERS INFUSION - SIMPLE MED
INTRAVENOUS | Status: DC | PRN
  Administered 2018-02-02: 1000 mL via INTRAVENOUS

## 2018-02-02 MED ORDER — IBUPROFEN 800 MG PO TABS
800.0000 mg | ORAL_TABLET | Freq: Four times a day (QID) | ORAL | Status: DC
  Administered 2018-02-02 – 2018-02-04 (×8): 800 mg via ORAL
  Filled 2018-02-02 (×9): qty 1

## 2018-02-02 MED ORDER — LACTATED RINGERS IV SOLN
INTRAVENOUS | Status: DC
  Administered 2018-02-02: 11:00:00 via INTRAVENOUS

## 2018-02-02 MED ORDER — PRENATAL MULTIVITAMIN CH
1.0000 | ORAL_TABLET | Freq: Every day | ORAL | Status: DC
  Administered 2018-02-03 – 2018-02-04 (×2): 1 via ORAL
  Filled 2018-02-02 (×2): qty 1

## 2018-02-02 MED ORDER — LIDOCAINE 5 % EX PTCH
MEDICATED_PATCH | CUTANEOUS | Status: DC | PRN
  Administered 2018-02-02: 1 via TRANSDERMAL

## 2018-02-02 MED ORDER — DEXAMETHASONE SODIUM PHOSPHATE 10 MG/ML IJ SOLN
INTRAMUSCULAR | Status: AC
  Filled 2018-02-02: qty 1

## 2018-02-02 MED ORDER — SODIUM CHLORIDE 0.9 % IV SOLN
INTRAVENOUS | Status: DC | PRN
  Administered 2018-02-02: 25 ug/min via INTRAVENOUS

## 2018-02-02 MED ORDER — FERROUS SULFATE 325 (65 FE) MG PO TABS
325.0000 mg | ORAL_TABLET | Freq: Every day | ORAL | Status: DC
  Administered 2018-02-03 – 2018-02-04 (×2): 325 mg via ORAL
  Filled 2018-02-02 (×2): qty 1

## 2018-02-02 SURGICAL SUPPLY — 25 items
BAG COUNTER SPONGE EZ (MISCELLANEOUS) ×2 IMPLANT
CANISTER SUCT 3000ML PPV (MISCELLANEOUS) ×3 IMPLANT
CHLORAPREP W/TINT 26ML (MISCELLANEOUS) ×6 IMPLANT
COUNTER SPONGE BAG EZ (MISCELLANEOUS) ×1
DRSG TELFA 3X8 NADH (GAUZE/BANDAGES/DRESSINGS) ×3 IMPLANT
ELECT REM PT RETURN 9FT ADLT (ELECTROSURGICAL) ×3
ELECTRODE REM PT RTRN 9FT ADLT (ELECTROSURGICAL) ×1 IMPLANT
GAUZE SPONGE 4X4 12PLY STRL (GAUZE/BANDAGES/DRESSINGS) ×3 IMPLANT
GLOVE BIO SURGEON STRL SZ 6.5 (GLOVE) ×2 IMPLANT
GLOVE BIO SURGEONS STRL SZ 6.5 (GLOVE) ×1
GLOVE INDICATOR 7.0 STRL GRN (GLOVE) ×3 IMPLANT
GOWN STRL REUS W/ TWL LRG LVL3 (GOWN DISPOSABLE) ×2 IMPLANT
GOWN STRL REUS W/TWL LRG LVL3 (GOWN DISPOSABLE) ×4
KIT TURNOVER KIT A (KITS) ×3 IMPLANT
NS IRRIG 1000ML POUR BTL (IV SOLUTION) ×3 IMPLANT
PACK C SECTION AR (MISCELLANEOUS) ×3 IMPLANT
PAD OB MATERNITY 4.3X12.25 (PERSONAL CARE ITEMS) ×3 IMPLANT
PAD PREP 24X41 OB/GYN DISP (PERSONAL CARE ITEMS) ×3 IMPLANT
SUT MNCRL AB 4-0 PS2 18 (SUTURE) ×3 IMPLANT
SUT PLAIN 2 0 XLH (SUTURE) IMPLANT
SUT VIC AB 0 CT1 36 (SUTURE) ×12 IMPLANT
SUT VIC AB 2-0 CT1 27 (SUTURE) ×4
SUT VIC AB 2-0 CT1 TAPERPNT 27 (SUTURE) ×2 IMPLANT
SUT VIC AB 3-0 SH 27 (SUTURE) ×2
SUT VIC AB 3-0 SH 27X BRD (SUTURE) ×1 IMPLANT

## 2018-02-02 NOTE — Anesthesia Post-op Follow-up Note (Signed)
Anesthesia QCDR form completed.        

## 2018-02-02 NOTE — Anesthesia Preprocedure Evaluation (Signed)
Anesthesia Evaluation  Patient identified by MRN, date of birth, ID band Patient awake    Reviewed: Allergy & Precautions, NPO status , Patient's Chart, lab work & pertinent test results  Airway Mallampati: II  TM Distance: >3 FB     Dental no notable dental hx.    Pulmonary former smoker,    Pulmonary exam normal        Cardiovascular negative cardio ROS Normal cardiovascular exam     Neuro/Psych Anxiety Depression negative neurological ROS     GI/Hepatic negative GI ROS, Neg liver ROS,   Endo/Other  negative endocrine ROS  Renal/GU negative Renal ROS  negative genitourinary   Musculoskeletal negative musculoskeletal ROS (+)   Abdominal Normal abdominal exam  (+)   Peds negative pediatric ROS (+)  Hematology negative hematology ROS (+)   Anesthesia Other Findings   Reproductive/Obstetrics (+) Pregnancy                             Anesthesia Physical  Anesthesia Plan  ASA: II  Anesthesia Plan: Spinal   Post-op Pain Management:    Induction: Intravenous  PONV Risk Score and Plan:   Airway Management Planned: Nasal Cannula  Additional Equipment:   Intra-op Plan:   Post-operative Plan:   Informed Consent: I have reviewed the patients History and Physical, chart, labs and discussed the procedure including the risks, benefits and alternatives for the proposed anesthesia with the patient or authorized representative who has indicated his/her understanding and acceptance.   Dental advisory given  Plan Discussed with: CRNA and Surgeon  Anesthesia Plan Comments:         Anesthesia Quick Evaluation

## 2018-02-02 NOTE — Transfer of Care (Addendum)
Immediate Anesthesia Transfer of Care Note  Patient: Heather Cross  Procedure(s) Performed: CESAREAN SECTION WITH BILATERAL TUBAL LIGATION (Bilateral )  Patient Location: PACU  Anesthesia Type:Spinal  Level of Consciousness: awake, alert , oriented and patient cooperative  Airway & Oxygen Therapy: Patient Spontanous Breathing  Post-op Assessment: Report given to RN, Post -op Vital signs reviewed and stable and Patient moving all extremities  Post vital signs: Reviewed and stable  Last Vitals:  Vitals Value Taken Time  BP 93/81 02/02/2018  1:10 PM  Temp    Pulse 70 02/02/2018  1:14 PM  Resp 13 02/02/2018  1:14 PM  SpO2 100 % 02/02/2018  1:14 PM  Vitals shown include unvalidated device data.  Last Pain:  Vitals:   02/02/18 1310  TempSrc:   PainSc: 0-No pain         Complications: No apparent anesthesia complications

## 2018-02-02 NOTE — Lactation Note (Signed)
This note was copied from a baby's chart. Lactation Consultation Note  Patient Name: Heather Cross [id number] Date: 02/02/2018 Reason for consult: Initial assessment;1st time breastfeeding   Maternal Data Formula Feeding for Exclusion: No Does the patient have breastfeeding experience prior to this delivery?: Yes  Feeding Feeding Type: Breast Fed Length of feed: 40 min  LATCH Score Latch: Grasps breast easily, tongue down, lips flanged, rhythmical sucking.  Audible Swallowing: A few with stimulation  Type of Nipple: Everted at rest and after stimulation  Comfort (Breast/Nipple): Soft / non-tender  Hold (Positioning): Assistance needed to correctly position infant at breast and maintain latch.  LATCH Score: 8  Interventions Interventions: Breast feeding basics reviewed;Assisted with latch;Skin to skin  Lactation Tools Discussed/Used     Consult Status Consult Status: Follow-up Date: 02/02/18 Follow-up type: In-patient    Ferol Luz 02/02/2018, 3:33 PM

## 2018-02-02 NOTE — Op Note (Signed)
Cesarean Section Procedure Note  Indications: previous uterine incision low transverse x 2  Pre-operative Diagnosis: 39 week 3 day pregnancy, prior C-section x 2, multiparous desiring permanent sterilization.  Post-operative Diagnosis: Same, with small fundal fibroid  Surgeon: Rubie Maid, MD  Assistants: Philip Aspen, CNM. No other capable assistant available in surgery requiring high level assistant.  Procedure: Repeat low transverse Cesarean Section with Bilateral Tubal Ligation  Anesthesia: Spinal anesthesia  Procedure Details: The patient was seen in the Holding Room. The risks, benefits, complications, treatment options, and expected outcomes were discussed with the patient.  The patient concurred with the proposed plan, giving informed consent.  The site of surgery properly noted/marked. The patient was taken to the Operating Room, identified as Heather Cross and the procedure verified as repeat C-Section Delivery with Bilateral Tubal Ligation. A Time Out was held and the above information confirmed.  After induction of anesthesia, the patient was draped and prepped in the usual sterile manner. Anesthesia was tested and noted to be adequate. A Pfannenstiel incision was made and carried down through the subcutaneous tissue to the fascia. Fascial incision was made and extended transversely. The fascia was separated from the underlying rectus tissue superiorly and inferiorly. The peritoneum was identified and entered. Peritoneal incision was extended longitudinally. The surgical assist was able to provide retraction to allow for clear visualization of surgical site. The utero-vesical peritoneal reflection was incised transversely and the bladder flap was bluntly freed from the lower uterine segment. A low transverse uterine incision was made. Delivered from cephalic presentation was a 2860 gram Female with Apgar scores of 9 at one minute and 10 at five minutes.  The assistant was able  to apply adequate fundal pressure to allow for successful delivery of the fetus. After the umbilical cord was clamped and cut cord blood was obtained for evaluation. The placenta was removed intact and appeared normal. The uterus was exteriorized and cleared of all clots and debris. The uterine outline, tubes and ovaries appeared normal.  The uterine incision was closed with running locked suture of 2-0-Vicryl.  A suture of 0-Vicryl was used in an imbricating layer.  Hemostasis was observed. Lavage was carried out until clear. The fascia was then reapproximated with a running suture of 0-Vicryl. The skin was reapproximated with 4-0 Monocryl.  The incision was covered with Dermabond.  Instrument, sponge, and needle counts were correct prior the abdominal closure and at the conclusion of the case.   Findings: Female infant, cephalic presentation, 3419 grams, with Apgar scores of 9 at one minute and 10 at five minutes. Intact placenta with 3 vessel cord.  Clear amniotic fluid at amniotomy The uterine outline, tubes and ovaries appeared normal with exception of small anterior fundal fibroid 0.5 cm).   Estimated Blood Loss:  650 ml      Drains: foley catheter to gravity drainage, 100 ml of clear urine at end of the procedure         Total IV Fluids:  2000 ml  Specimens: Placenta and Disposition:  Sent to Pathology         Implants: None         Complications:  None; patient tolerated the procedure well.         Disposition: PACU - hemodynamically stable.         Condition: stable   Rubie Maid, MD [facility]

## 2018-02-02 NOTE — Plan of Care (Signed)
Pt VSS, ability to move in bed has improved. Bleeding WNL and incision CDIContinue to assess pt.
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
## 2018-02-02 NOTE — Anesthesia Procedure Notes (Signed)
Spinal  Patient location during procedure: OB Start time: 02/02/2018 11:47 AM End time: 02/02/2018 11:55 AM Staffing Anesthesiologist: Alvin Critchley, MD Resident/CRNA: Nile Riggs, CRNA Performed: resident/CRNA  Preanesthetic Checklist Completed: patient identified, site marked, surgical consent, pre-op evaluation, timeout performed, IV checked, risks and benefits discussed and monitors and equipment checked Spinal Block Patient position: sitting Prep: ChloraPrep Patient monitoring: heart rate, continuous pulse ox and blood pressure Approach: midline Location: L3-4 Injection technique: single-shot Needle Needle type: Pencan and Introducer  Needle gauge: 24 G Assessment Sensory level: T4 Additional Notes O2 and monitors placed; timeout done. Pt tolerated procedure well. See flowsheet for medication doses.

## 2018-02-03 ENCOUNTER — Encounter: Payer: Self-pay | Admitting: Obstetrics and Gynecology

## 2018-02-03 LAB — CBC
HCT: 35.3 % (ref 35.0–47.0)
Hemoglobin: 12.1 g/dL (ref 12.0–16.0)
MCH: 29 pg (ref 26.0–34.0)
MCHC: 34.1 g/dL (ref 32.0–36.0)
MCV: 84.9 fL (ref 80.0–100.0)
PLATELETS: 248 10*3/uL (ref 150–440)
RBC: 4.17 MIL/uL (ref 3.80–5.20)
RDW: 15.1 % — ABNORMAL HIGH (ref 11.5–14.5)
WBC: 13.3 10*3/uL — ABNORMAL HIGH (ref 3.6–11.0)

## 2018-02-03 NOTE — Anesthesia Postprocedure Evaluation (Signed)
Anesthesia Post Note  Patient: Heather Cross  Procedure(s) Performed: CESAREAN SECTION WITH BILATERAL TUBAL LIGATION (Bilateral )  Patient location during evaluation: Mother Baby Anesthesia Type: Spinal Level of consciousness: oriented and awake and alert Pain management: pain level controlled Vital Signs Assessment: post-procedure vital signs reviewed and stable Respiratory status: spontaneous breathing Cardiovascular status: blood pressure returned to baseline and stable Postop Assessment: no headache, no backache, no apparent nausea or vomiting and patient able to bend at knees Anesthetic complications: no     Last Vitals:  Vitals:   02/03/18 0309 02/03/18 0802  BP: (!) 104/57 98/61  Pulse: 79 85  Resp: 18 18  Temp: 36.6 C 36.7 C  SpO2: 99% 98%    Last Pain:  Vitals:   02/03/18 0802  TempSrc: Oral  PainSc:                  Precious Haws [name]

## 2018-02-03 NOTE — Lactation Note (Signed)
This note was copied from a baby's chart. Lactation Consultation Note  Patient Name: Heather Cross [id number] Date: 02/03/2018 Reason for consult: Term;Follow-up assessment  [name] was fussing and rooting and sucking on fingers after newborn screening.  Easily hand expressed lots of colostrum from left breast.  She went on the breast without difficulty and immediately began good rhythmic sucking with swallows.   Maternal Data Formula Feeding for Exclusion: No Has patient been taught Hand Expression?: Yes(Can easily hand express lots of colostrum) Does the patient have breastfeeding experience prior to this delivery?: Yes  Feeding Feeding Type: Breast Fed Length of feed: 5 min  LATCH Score Latch: Grasps breast easily, tongue down, lips flanged, rhythmical sucking.  Audible Swallowing: A few with stimulation  Type of Nipple: Everted at rest and after stimulation  Comfort (Breast/Nipple): Filling, red/small blisters or bruises, mild/mod discomfort  Hold (Positioning): No assistance needed to correctly position infant at breast.  LATCH Score: 8  Interventions Interventions: Breast compression;Breast massage;Support pillows  Lactation Tools Discussed/Used WIC Program: Yes   Consult Status Consult Status: PRN Follow-up type: Call as needed    Jarold Motto 02/03/2018, 4:35 PM

## 2018-02-03 NOTE — Anesthesia Post-op Follow-up Note (Signed)
  Anesthesia Pain Follow-up Note  Patient: Heather Cross  Day #: 1  Date of Follow-up: 02/03/2018 Time: 8:06 AM  Last Vitals:  Vitals:   02/03/18 0309 02/03/18 0802  BP: (!) 104/57 98/61  Pulse: 79 85  Resp: 18 18  Temp: 36.6 C 36.7 C  SpO2: 99% 98%    Level of Consciousness: alert  Pain: mild   Side Effects:None  Catheter Site Exam:clean, dry     Plan: D/C from anesthesia care at surgeon's request  [place]

## 2018-02-03 NOTE — Lactation Note (Signed)
This note was copied from a baby's chart. Lactation Consultation Note  Patient Name: Heather Cross [id number]'C Date: 02/03/2018 Reason for consult: Follow-up assessment;Term;Other (Comment)(Difficulty latch to right - prefers left)  Assisted mom with positioning with pillow support in modified cross cradle hold skin to skin.  Demonstrated hand expression.  Mom reports can get [facility] to latch to left breast and suck without a problem.  But she does not like right breast.  After finally getting her to latch, she takes a few sucks and then is on and off the breast.  Heather Cross initially did have problems sustaining the latch and kept coming off and taking deep breaths and going back.  Repositioned her, angling her head back with helped her to sustain the latch for long periods.  Demonstrated how to massage breast to keep her sucking more actively at the breast.  Reviewed supply and demand, normal course of lactation and routine newborn feeding patterns.  Lactation name and number written on white board and encouraged to call with any questions, concerns or assistancae. Maternal Data Formula Feeding for Exclusion: No Has patient been taught Hand Expression?: Yes Does the patient have breastfeeding experience prior to this delivery?: Yes  Feeding Feeding Type: Breast Fed Length of feed: 10 min  LATCH Score Latch: Repeated attempts needed to sustain latch, nipple held in mouth throughout feeding, stimulation needed to elicit sucking reflex.  Audible Swallowing: A few with stimulation  Type of Nipple: Everted at rest and after stimulation  Comfort (Breast/Nipple): Filling, red/small blisters or bruises, mild/mod discomfort  Hold (Positioning): Assistance needed to correctly position infant at breast and maintain latch.  LATCH Score: 6  Interventions Interventions: Breast feeding basics reviewed;Reverse pressure;Assisted with latch;Breast compression;Skin to skin;Adjust position;Breast  massage;Support pillows;Hand express;Position options  Lactation Tools Discussed/Used WIC Program: Yes   Consult Status Consult Status: Follow-up Date: 02/03/18 Follow-up type: Call as needed    Jarold Motto 02/03/2018, 1:01 PM

## 2018-02-03 NOTE — Progress Notes (Signed)
Postpartum Day # 1: Cesarean Delivery  Subjective: Patient reports tolerating PO, + flatus and no problems voiding.  Ambulating in room without difficulty. Pain is well controlled.   Objective: Vital signs in last 24 hours: Temp:  [96.8 F (36 C)-98.9 F (37.2 C)] 98 F (36.7 C) (08/31 0802) Pulse Rate:  [60-106] 85 (08/31 0802) Resp:  [13-26] 18 (08/31 0802) BP: (90-113)/(52-81) 98/61 (08/31 0802) SpO2:  [96 %-100 %] 98 % (08/31 0802)  Physical Exam:  General: alert and no distress Lungs: clear to auscultation bilaterally Breasts: normal appearance, no masses or tenderness Heart: regular rate and rhythm, S1, S2 normal, no murmur, click, rub or gallop Abdomen: soft, non-tender; bowel sounds normal; no masses,  no organomegaly Pelvis: Lochia appropriate, Uterine Fundus firm, Incision: healing well, no significant drainage, no dehiscence, no significant erythema Extremities: DVT Evaluation: No evidence of DVT seen on physical exam. Negative Homan's  Sign. No cords or calf tenderness. No significant calf/ankle edema.  Recent Labs    02/01/18 1112 02/03/18 0703  HGB 13.0 12.1  HCT 37.7 35.3    Assessment/Plan: Status post Cesarean section. Doing well postoperatively.  Breastfeeding but may also supplement with formula as needed. Contraception intrapartum BTL Regular diet as tolerated Continue PO pain management Continue current care. Plan for discharge tomorrow   Rubie Maid, MD [facility]

## 2018-02-04 ENCOUNTER — Encounter: Payer: Self-pay | Admitting: Obstetrics and Gynecology

## 2018-02-04 ENCOUNTER — Ambulatory Visit: Payer: Self-pay

## 2018-02-04 MED ORDER — IBUPROFEN 800 MG PO TABS: 800.0000 mg | ORAL_TABLET | Freq: Three times a day (TID) | ORAL | 1 refills | Status: DC | PRN

## 2018-02-04 MED ORDER — DOCUSATE SODIUM 100 MG PO CAPS: 100.0000 mg | ORAL_CAPSULE | Freq: Two times a day (BID) | ORAL | 2 refills | Status: DC | PRN

## 2018-02-04 MED ORDER — OXYCODONE-ACETAMINOPHEN 5-325 MG PO TABS: 1.0000 | ORAL_TABLET | Freq: Four times a day (QID) | ORAL | 0 refills | Status: DC | PRN

## 2018-02-04 NOTE — Discharge Summary (Signed)
OB Discharge Summary     Patient Name: Heather Cross DOB: [birth year] MRN: [id number]  Date of admission: 02/02/2018 Delivering MD: Rubie Maid   Date of discharge: 02/04/2018  Admitting diagnosis: prior c section x 2 Intrauterine pregnancy: [redacted]w[redacted]d     Secondary diagnosis:  Principal Problem:   History of 2 cesarean sections Active Problems:   Anxiety and depression   S/P cesarean section  Additional problems: None     Discharge diagnosis: Term Pregnancy Delivered                                                                                                Post partum procedures:postpartum tubal ligation  Augmentation: None  Complications: None  Hospital course:  Sceduled C/S   32 y.o. yo G3P3003 at [redacted]w[redacted]d was admitted to the hospital 02/02/2018 for scheduled cesarean section with the following indication:Elective Repeat and prior C-section x 2.  Membrane Rupture Time/Date: 12:18 PM ,02/02/2018   Patient delivered a Viable infant.02/02/2018  Details of operation can be found in separate operative note.  Pateint had an uncomplicated postpartum course.  She is ambulating, tolerating a regular diet, passing flatus, and urinating well. Patient is discharged home in stable condition on  02/04/18         Physical exam  Vitals:   02/03/18 1150 02/03/18 1605 02/04/18 0050 02/04/18 0728  BP: 114/67 116/67 114/79 120/83  Pulse: 75 96 85 100  Resp: 18 18  18   Temp: 98.4 F (36.9 C) 98.5 F (36.9 C) 97.8 F (36.6 C) 98.2 F (36.8 C)  TempSrc: Oral Oral Oral Oral  SpO2: 97% 98% 98% 98%  Weight:      Height:       General: alert Lochia: appropriate Uterine Fundus: firm Incision: Healing well with no significant drainage, No significant erythema, Dressing is clean, dry, and intact DVT Evaluation: No evidence of DVT seen on physical exam. Negative Homan's sign. No cords or calf tenderness. No significant calf/ankle edema. Labs: Lab Results  Component Value Date   WBC  13.3 (H) 02/03/2018   HGB 12.1 02/03/2018   HCT 35.3 02/03/2018   MCV 84.9 02/03/2018   PLT 248 02/03/2018   CMP Latest Ref Rng & Units 12/10/2017  Glucose 70 - 99 mg/dL 106(H)  BUN 6 - 20 mg/dL 9  Creatinine 0.44 - 1.00 mg/dL 0.31(L)  Sodium 135 - 145 mmol/L 137  Potassium 3.5 - 5.1 mmol/L 3.8  Chloride 98 - 111 mmol/L 108  CO2 22 - 32 mmol/L 19(L)  Calcium 8.9 - 10.3 mg/dL 9.1  Total Protein 6.5 - 8.1 g/dL 7.5  Total Bilirubin 0.3 - 1.2 mg/dL 0.6  Alkaline Phos 38 - 126 U/L 95  AST 15 - 41 U/L 15  ALT 0 - 44 U/L 9    Discharge instruction: per After Visit Summary and "Baby and Me Booklet".  After visit meds:  Allergies as of 02/04/2018   No Known Allergies     Medication List    TAKE these medications   CONCEPT DHA 53.5-38-1 MG Caps Take 53.5 mg by mouth daily.  docusate sodium 100 MG capsule Commonly known as:  COLACE Take 1 capsule (100 mg total) by mouth 2 (two) times daily as needed.   ibuprofen 800 MG tablet Commonly known as:  ADVIL,MOTRIN Take 1 tablet (800 mg total) by mouth every 8 (eight) hours as needed.   oxyCODONE-acetaminophen 5-325 MG tablet Commonly known as:  PERCOCET/ROXICET Take 1-2 tablets by mouth every 6 (six) hours as needed (pain scale 4-7).       Diet: routine diet  Activity: Advance as tolerated. Pelvic rest for 6 weeks.   Outpatient follow up:1 week incision check Follow up Appt:No future appointments. Follow up Visit:No follow-ups on file.  Postpartum contraception: Tubal Ligation  Newborn Data: Live born female  Birth Weight: 6 lb 4.9 oz (2860 g) APGAR: 5, 10  Newborn Delivery   Birth date/time:  02/02/2018 12:19:00 Delivery type:  C-Section, Low Transverse Trial of labor:  No C-section categorization:  Repeat     Baby Feeding: Breast Disposition:home with mother   02/04/2018 Rubie Maid, MD  [facility]

## 2018-02-04 NOTE — Progress Notes (Signed)
Discharge instructions given. Patient verbalizes understanding of teaching. Patient discharged home via wheelchair at 1400. 

## 2018-02-04 NOTE — Lactation Note (Signed)
This note was copied from a baby's chart. Lactation Consultation Note  Patient Name: Heather Cross [id number] Date: 02/04/2018   Mom had given bottles of formula and was having difficulty getting [name] to latch back on the breast.  Mom's breasts are firm and warm and tender to touch.  Reviewed hand expression to soften areola for deeper latch.  After she was latched, she began strong rhythmic sucking and swallowing.  As breast was massaged, transitional breast milk was pouring from the corners of [name]'s mouth and milk was dripping from other breast as she was feeding.  Right breast where [facility] had breast fed was softer.  Left breast was still firm and could not get [facility] to stay awake to nurse from left breast.  Massaged and pumped left breast and got almost 20 ml in short period.  Demonstrated to mom and FOB how to use single and double manual pump to relieve tension in breast if could not get [facility] to latch and prevent engorgement.  Discussed community lactation resources, support groups and contact numbers if have further questions or concerns or if assistance needed.  Maternal Data    Feeding    LATCH Score                   Interventions    Lactation Tools Discussed/Used     Consult Status      Jarold Motto 02/04/2018, 10:13 PM

## 2018-02-06 LAB — SURGICAL PATHOLOGY

## 2018-02-12 ENCOUNTER — Ambulatory Visit (INDEPENDENT_AMBULATORY_CARE_PROVIDER_SITE_OTHER): Payer: Medicaid Other | Admitting: Obstetrics and Gynecology

## 2018-02-12 ENCOUNTER — Encounter: Payer: Self-pay | Admitting: Obstetrics and Gynecology

## 2018-02-12 VITALS — BP 112/72 | HR 105 | Ht 65.5 in | Wt 164.3 lb

## 2018-02-12 DIAGNOSIS — Z4889 Encounter for other specified surgical aftercare: Secondary | ICD-10-CM

## 2018-02-12 DIAGNOSIS — Z98891 History of uterine scar from previous surgery: Secondary | ICD-10-CM

## 2018-02-12 DIAGNOSIS — Z9851 Tubal ligation status: Secondary | ICD-10-CM

## 2018-02-12 NOTE — Progress Notes (Signed)
   PT is present today for a incision check after having a c-section. Pt stated that the incision is healing well.  Pt stated that she is doing well no complaints.

## 2018-02-12 NOTE — Progress Notes (Signed)
    OBSTETRICS/GYNECOLOGY POST-OPERATIVE CLINIC VISIT  Subjective:     Heather Cross is a 32 y.o. female who presents to the clinic 1 weeks status post repeat low transverse Cesarean section with BTL for history of C-section x 2, desiring permanent sterilization. Eating a regular diet without difficulty. Bowel movements are normal (but sometimes too lose). Pain is controlled with current analgesics. Medications being used: prescription NSAID's including ibuprofen (Motrin).  The following portions of the patient's history were reviewed and updated as appropriate: allergies, current medications, past family history, past medical history, past social history, past surgical history and problem list.  Review of Systems Pertinent items noted in HPI and remainder of comprehensive ROS otherwise negative.    Objective:    BP 112/72   Pulse (!) 105   Ht 5' 5.5" (1.664 m)   Wt 164 lb 4.8 oz (74.5 kg)   Breastfeeding? No   BMI 26.93 kg/m  General:  alert and no distress  Abdomen: soft, bowel sounds active, non-tender  Incision:   healing well, no drainage, no erythema, no hernia, no seroma, no swelling, no dehiscence, incision well approximated    Pathology:  A. FALLOPIAN TUBE SEGMENT, RIGHT; TUBAL LIGATION:  - FALLOPIAN TUBE WITH FULL CROSS-SECTION OF THE LUMEN EXAMINED.   B. FALLOPIAN TUBE SEGMENT, LEFT; TUBAL LIGATION:  - FALLOPIAN TUBE WITH FULL CROSS-SECTION OF THE LUMEN EXAMINED.   Assessment:    Doing well postoperatively.  S/p Cesarean section with BTL   Plan:   1. Continue any current medications. Advised to decrease Colace to once daily instead of BID for stools. 2. Wound care discussed. 3. Operative findings again reviewed. Pathology report discussed. 4. Activity restrictions: no bending, stooping, or squatting and no lifting more than 10-15 pounds 5. Anticipated return to work: not applicable. 6. Follow up: 5 weeks for postpartum visit.     Rubie Maid,  MD [facility]

## 2018-02-22 ENCOUNTER — Encounter: Payer: Medicaid Other | Admitting: Obstetrics and Gynecology

## 2018-03-19 ENCOUNTER — Encounter: Payer: Medicaid Other | Admitting: Obstetrics and Gynecology

## 2018-03-20 ENCOUNTER — Ambulatory Visit (INDEPENDENT_AMBULATORY_CARE_PROVIDER_SITE_OTHER): Payer: Medicaid Other | Admitting: Obstetrics and Gynecology

## 2018-03-20 ENCOUNTER — Encounter: Payer: Self-pay | Admitting: Obstetrics and Gynecology

## 2018-03-20 DIAGNOSIS — M545 Low back pain, unspecified: Secondary | ICD-10-CM

## 2018-03-20 DIAGNOSIS — Z98891 History of uterine scar from previous surgery: Secondary | ICD-10-CM

## 2018-03-20 NOTE — Progress Notes (Signed)
   OBSTETRICS POSTPARTUM CLINIC PROGRESS NOTE  Subjective:     Heather Cross is a 32 y.o. [id number] female who presents for a postpartum visit. She is 6 weeks postpartum following a repeat low cervical transverse Cesarean section with bilateral tubal ligation. I have fully reviewed the prenatal and intrapartum course. The delivery was at 53 gestational weeks.  Anesthesia: spinal. Postpartum course has been well. Baby's course has been well. Baby is feeding by both breast and bottle. Bleeding: patient has resumed menses, with LMP 03/12/2018. Bowel function is normal. Bladder function is normal. Patient is not sexually active. Contraception method desired is tubal ligation (performed with last C-section). Postpartum depression screening: negative (EPDS screening score is 0).  The following portions of the patient's history were reviewed and updated as appropriate: allergies, current medications, past family history, past medical history, past social history, past surgical history and problem list.  Review of Systems A comprehensive review of systems was negative except for: Musculoskeletal: positive for back pain.  Patient notes back pain in lumbo-sacral region.  Has been ongoing for several weeks. Per FOB, patient's back was noted to be more curved than usual.   Objective:    BP 110/76   Pulse 94   Ht 5' 5.5" (1.664 m)   Wt 169 lb 6.4 oz (76.8 kg)   Breastfeeding? Yes   BMI 27.76 kg/m   General:  alert and no distress   Breasts:  inspection negative, no nipple discharge or bleeding, no masses or nodularity palpable  Lungs: clear to auscultation bilaterally  Heart:  regular rate and rhythm, S1, S2 normal, no murmur, click, rub or gallop  Back: Mild tenderness at midline of sacral region.  No lesions or CVA tenderness. Symmetric, normal curvature.   Abdomen: soft, non-tender; bowel sounds normal; no masses,  no organomegaly.  Well healed Pfannenstiel incision   Vulva:  normal  Vagina:  normal vagina, no discharge, exudate, lesion, or erythema  Cervix:  no cervical motion tenderness and no lesions  Corpus: normal size, contour, position, consistency, mobility, non-tender  Adnexa:  normal adnexa and no mass, fullness, tenderness  Rectal Exam: Not performed.         Labs:  Lab Results  Component Value Date   HGB 12.1 02/03/2018     Assessment:    Routine postpartum exam.  S/p Cesarean section with BTL  Back pain    Plan:    1. Contraception: tubal ligation.  2. Discussed use of heating/cooling pads to the area, given info on stretches, can use NSAIDs as needed. If back pain continues, can provide further workup.  3. Follow up in: 2 months or as needed.    Rubie Maid, MD [facility]

## 2018-03-20 NOTE — Progress Notes (Signed)
Pt stated that she is doing well expect for some lower back pain. EPDS=0

## 2018-03-20 NOTE — Patient Instructions (Signed)

## 2018-05-21 ENCOUNTER — Encounter: Payer: Medicaid Other | Admitting: Obstetrics and Gynecology

## 2018-05-24 ENCOUNTER — Encounter: Payer: Medicaid Other | Admitting: Obstetrics and Gynecology

## 2018-07-05 ENCOUNTER — Encounter: Payer: Medicaid Other | Admitting: Obstetrics and Gynecology

## 2018-07-06 ENCOUNTER — Encounter: Payer: Self-pay | Admitting: Obstetrics and Gynecology

## 2018-07-06 ENCOUNTER — Ambulatory Visit (INDEPENDENT_AMBULATORY_CARE_PROVIDER_SITE_OTHER): Payer: Medicaid Other | Admitting: Obstetrics and Gynecology

## 2018-07-06 VITALS — BP 122/81 | HR 105 | Ht 65.5 in | Wt 176.2 lb

## 2018-07-06 DIAGNOSIS — R460 Very low level of personal hygiene: Secondary | ICD-10-CM

## 2018-07-06 DIAGNOSIS — Z0000 Encounter for general adult medical examination without abnormal findings: Secondary | ICD-10-CM | POA: Diagnosis not present

## 2018-07-06 NOTE — Progress Notes (Signed)
Patient comes in today for her yearly physical. She is not due for her Pap until 2021. She does not have PCP so will need labs today. Patient states that she has two skin tags under her breast that are painful.
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
## 2018-08-08 ENCOUNTER — Encounter: Payer: Self-pay | Admitting: Obstetrics and Gynecology

## 2018-08-08 ENCOUNTER — Ambulatory Visit (INDEPENDENT_AMBULATORY_CARE_PROVIDER_SITE_OTHER): Payer: Medicaid Other | Admitting: Obstetrics and Gynecology

## 2018-08-08 VITALS — BP 102/70 | HR 88 | Ht 65.0 in | Wt 177.5 lb

## 2018-08-08 DIAGNOSIS — N912 Amenorrhea, unspecified: Secondary | ICD-10-CM

## 2018-08-08 LAB — POCT URINE PREGNANCY: Preg Test, Ur: NEGATIVE

## 2018-08-08 NOTE — Progress Notes (Signed)
Patient comes in today for amenorrhea. She has not had a cycle since mid January.
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
## 2018-11-27 ENCOUNTER — Telehealth: Payer: Self-pay

## 2018-11-27 NOTE — Telephone Encounter (Signed)
LMTRC for prescreening.  

## 2018-11-27 NOTE — Progress Notes (Deleted)
Pt present today for having pregnancy symptoms with a negative pregnancy test.

## 2018-11-28 ENCOUNTER — Encounter: Payer: Medicaid Other | Admitting: Obstetrics and Gynecology

## 2018-12-06 ENCOUNTER — Encounter: Payer: Medicaid Other | Admitting: Obstetrics and Gynecology

## 2018-12-17 ENCOUNTER — Telehealth: Payer: Self-pay

## 2018-12-17 NOTE — Telephone Encounter (Signed)
Pt prescreened no symptoms has face mask.   Coronavirus (COVID-19) Are you at risk?  Are you at risk for the Coronavirus (COVID-19)?  To be considered HIGH RISK for Coronavirus (COVID-19), you have to meet the following criteria:  . Traveled to [place], [place], [place], [place] or [place]; or in the [place] to [place], [place], [place], or [place]; and have fever, cough, and shortness of breath within the last 2 weeks of travel OR . Been in close contact with a person diagnosed with COVID-19 within the last 2 weeks and have fever, cough, and shortness of breath . IF YOU DO NOT MEET THESE CRITERIA, YOU ARE CONSIDERED LOW RISK FOR COVID-19.  What to do if you are HIGH RISK for COVID-19?  . If you are having a medical emergency, call 911. . Seek medical care right away. Before you go to a doctor's office, urgent care or emergency department, call ahead and tell them about your recent travel, contact with someone diagnosed with COVID-19, and your symptoms. You should receive instructions from your physician's office regarding next steps of care.  . When you arrive at healthcare provider, tell the healthcare staff immediately you have returned from visiting [place], [place], [place], [place] or [place]; or traveled in the [place] to [place], [place], [place], or [place]; in the last two weeks or you have been in close contact with a person diagnosed with COVID-19 in the last 2 weeks.   . Tell the health care staff about your symptoms: fever, cough and shortness of breath. . After you have been seen by a medical provider, you will be either: o Tested for (COVID-19) and discharged home on quarantine except to seek medical care if symptoms worsen, and asked to  - Stay home and avoid contact with others until you get your results (4-5 days)  - Avoid travel on public transportation if possible (such as bus, train, or airplane) or o Sent to the Emergency Department by EMS for  evaluation, COVID-19 testing, and possible admission depending on your condition and test results.  What to do if you are LOW RISK for COVID-19?  Reduce your risk of any infection by using the same precautions used for avoiding the common cold or flu:  . Wash your hands often with soap and warm water for at least 20 seconds.  If soap and water are not readily available, use an alcohol-based hand sanitizer with at least 60% alcohol.  . If coughing or sneezing, cover your mouth and nose by coughing or sneezing into the elbow areas of your shirt or coat, into a tissue or into your sleeve (not your hands). . Avoid shaking hands with others and consider head nods or verbal greetings only. . Avoid touching your eyes, nose, or mouth with unwashed hands.  . Avoid close contact with people who are sick. . Avoid places or events with large numbers of people in one location, like concerts or sporting events. . Carefully consider travel plans you have or are making. . If you are planning any travel outside or inside the [place], visit the CDC's Travelers' Health webpage for the latest health notices. . If you have some symptoms but not all symptoms, continue to monitor at home and seek medical attention if your symptoms worsen. . If you are having a medical emergency, call 911.   ADDITIONAL HEALTHCARE OPTIONS FOR PATIENTS   Telehealth / e-Visit: [email or web address]           [facility]: [phone]  [facility]: [phone]                   [facility]: [phone]  

## 2018-12-18 ENCOUNTER — Other Ambulatory Visit: Payer: Self-pay

## 2018-12-18 ENCOUNTER — Other Ambulatory Visit (HOSPITAL_COMMUNITY)
Admission: RE | Admit: 2018-12-18 | Discharge: 2018-12-18 | Disposition: A | Discharge status: Home / Self Care | Payer: Medicaid Other | Source: Ambulatory Visit | Attending: Obstetrics and Gynecology | Admitting: Obstetrics and Gynecology

## 2018-12-18 ENCOUNTER — Ambulatory Visit (INDEPENDENT_AMBULATORY_CARE_PROVIDER_SITE_OTHER): Payer: Medicaid Other | Admitting: Obstetrics and Gynecology

## 2018-12-18 ENCOUNTER — Encounter: Payer: Self-pay | Admitting: Obstetrics and Gynecology

## 2018-12-18 VITALS — BP 118/78 | HR 80 | Ht 65.0 in | Wt 176.2 lb

## 2018-12-18 DIAGNOSIS — Z8742 Personal history of other diseases of the female genital tract: Secondary | ICD-10-CM

## 2018-12-18 DIAGNOSIS — Z30016 Encounter for initial prescription of transdermal patch hormonal contraceptive device: Secondary | ICD-10-CM

## 2018-12-18 DIAGNOSIS — N644 Mastodynia: Secondary | ICD-10-CM

## 2018-12-18 DIAGNOSIS — R5383 Other fatigue: Secondary | ICD-10-CM

## 2018-12-18 DIAGNOSIS — Z124 Encounter for screening for malignant neoplasm of cervix: Secondary | ICD-10-CM | POA: Diagnosis not present

## 2018-12-18 DIAGNOSIS — R635 Abnormal weight gain: Secondary | ICD-10-CM

## 2018-12-18 DIAGNOSIS — Z3200 Encounter for pregnancy test, result unknown: Secondary | ICD-10-CM

## 2018-12-18 DIAGNOSIS — Z1272 Encounter for screening for malignant neoplasm of vagina: Secondary | ICD-10-CM | POA: Diagnosis not present

## 2018-12-18 NOTE — Progress Notes (Signed)
    GYNECOLOGY PROGRESS NOTE  Subjective:    Patient ID: Heather Cross, female    DOB: 05/06/1986, 33 y.o.   MRN: [id number]  HPI  Patient is a 33 y.o. G94P3003 female who presents for complaints of pregnancy symptoms for several months. She has a history of a BTL ~ 1 year ago.  Notes that she has taken several pregnancy tests that were negative. She is reporting breast tenderness and swelling, nausea (however this resolved when she restarted her Zoloft in June), fatigue, and weight gain. Notes that she is having regular cycles, LMP was 12/07/2018, however has recently noticed some spotting over the past 1-2 days.   The following portions of the patient's history were reviewed and updated as appropriate: allergies, current medications, past family history, past medical history, past social history, past surgical history and problem list.  Review of Systems Pertinent items noted in HPI and remainder of comprehensive ROS otherwise negative.   Objective:   Blood pressure 118/78, pulse 80, height 5\' 5"  (1.651 m), weight 176 lb 3.2 oz (79.9 kg), last menstrual period 12/07/2018, currently breastfeeding. General appearance: alert and no distress Abdomen: soft, non-tender; bowel sounds normal; no masses,  no organomegaly Pelvic: external genitalia normal, rectovaginal septum normal.  Vagina with scant thin white discharge.  Cervix normal appearing, no lesions and no motion tenderness.  Uterus mobile, nontender, normal shape and size.  Adnexae non-palpable, nontender bilaterally.   Assessment:   Pregnancy symptoms (fatigue, breast swelling and tenderness, fatigue).  Need for cervical pap smear  Plan:   - Unclear cause of pregnancy related symptoms. Patient unable to leave a urine sample to perform a UPT. Ordered serum HCG. Will also check prolactin, TSH levels. Discussed that symptoms could be hormonally related. Discussed that symptoms may improve with a trial of hormones (contraception).   Patient ok to try. Discussed different hormonal options, patient desires to try the patch. Will give samples for 3 weeks. If samples work, would prescribe.  - Patient overdue for pap smear, had h/o LGSIL pap smear in 2017, with normal pap in 2018. No further follow up.  -Return to clinic if symptoms worsen or fail to improve.     Rubie Maid, MD [facility]

## 2018-12-18 NOTE — Progress Notes (Signed)
Pt is present today due to having pregnancy symptoms. Pt had her tubal on 12/04/18. Pt's LMP 12/07/18. Pt stated that it has been over 2 months since her last sexually encounter. Pt stated that her breast has been swollen, nauseate, fatigue and weight gain are her symptoms. UPT=

## 2018-12-19 ENCOUNTER — Other Ambulatory Visit: Payer: Medicaid Other

## 2018-12-20 LAB — TSH: TSH: 0.506 u[IU]/mL (ref 0.450–4.500)

## 2018-12-20 LAB — BETA HCG QUANT (REF LAB): hCG Quant: <1 m[IU]/mL

## 2018-12-20 LAB — PROLACTIN: Prolactin: 18.4 ng/mL (ref 4.8–23.3)

## 2018-12-25 LAB — CYTOLOGY - PAP
Diagnosis: NEGATIVE
HPV: NOT DETECTED

## 2019-01-10 ENCOUNTER — Other Ambulatory Visit: Payer: Self-pay

## 2019-01-10 MED ORDER — NORELGESTROMIN-ETH ESTRADIOL 150-35 MCG/24HR TD PTWK: 1.0000 | MEDICATED_PATCH | TRANSDERMAL | 12 refills | Status: DC

## 2019-03-19 ENCOUNTER — Other Ambulatory Visit: Payer: Self-pay

## 2019-03-19 DIAGNOSIS — Z20822 Contact with and (suspected) exposure to covid-19: Secondary | ICD-10-CM

## 2019-03-20 LAB — NOVEL CORONAVIRUS, NAA: SARS-CoV-2, NAA: NOT DETECTED

## 2019-03-22 ENCOUNTER — Other Ambulatory Visit: Payer: Self-pay

## 2019-03-22 DIAGNOSIS — Z20822 Contact with and (suspected) exposure to covid-19: Secondary | ICD-10-CM

## 2019-03-24 LAB — NOVEL CORONAVIRUS, NAA: SARS-CoV-2, NAA: NOT DETECTED

## 2019-03-26 ENCOUNTER — Ambulatory Visit: Payer: Medicaid Other | Admitting: Obstetrics and Gynecology

## 2019-04-09 ENCOUNTER — Encounter: Payer: Self-pay | Admitting: Obstetrics and Gynecology

## 2019-04-09 ENCOUNTER — Other Ambulatory Visit: Payer: Self-pay

## 2019-04-09 ENCOUNTER — Ambulatory Visit (INDEPENDENT_AMBULATORY_CARE_PROVIDER_SITE_OTHER): Payer: Medicaid Other | Admitting: Obstetrics and Gynecology

## 2019-04-09 VITALS — BP 119/75 | HR 86 | Ht 65.0 in | Wt 177.5 lb

## 2019-04-09 DIAGNOSIS — Z30017 Encounter for initial prescription of implantable subdermal contraceptive: Secondary | ICD-10-CM

## 2019-04-09 DIAGNOSIS — Z3202 Encounter for pregnancy test, result negative: Secondary | ICD-10-CM | POA: Diagnosis not present

## 2019-04-09 LAB — POCT URINE PREGNANCY: Preg Test, Ur: NEGATIVE

## 2019-04-09 MED ORDER — ETONOGESTREL 68 MG ~~LOC~~ IMPL: 68.0000 mg | DRUG_IMPLANT | Freq: Once | SUBCUTANEOUS | Status: AC

## 2019-04-09 NOTE — Progress Notes (Signed)
Patient presents today for nexplanon insertion. History of having nexplanon in 2013 and did not note any side effects. LMP started 04/06/19 and still bleeding. No current complaints at today's visit. All questions were answered and she would like to proceed with nexplanon implant.

## 2019-04-09 NOTE — Progress Notes (Signed)
Pt present for Nexplanon insertion. Pt stated that she was doing well no problems.

## 2019-04-09 NOTE — Patient Instructions (Signed)
NEXPLANON PLACEMENT POST-PROCEDURE INSTRUCTIONS ° °1. You may take Ibuprofen, Aleve or Tylenol for pain if needed.  Pain should resolve within in 24 hours. ° °2. You may have intercourse after 24 hours.  If you using this for birth control, it is effective immediately. ° °3. You need to call if you have any fever, heavy bleeding, or redness at insertion site. Irregular bleeding is common the first several months after having a Nexplanonplaced. You do not need to call for this reason unless you are concerned. ° °4. Shower or bathe as normal.  You can remove the bandage after 24 hours. ° °

## 2019-04-09 NOTE — Progress Notes (Signed)
     GYNECOLOGY OFFICE PROCEDURE NOTE  Heather Cross is a 33 y.o. [phone] here for Nexplanon insertion.  Last pap smear was on 12/08/2018 and was normal. Reports irregular cycles, sometimes having 2 per month. Has prior history or tubal ligation. Desires Nexplanon for better regulation of cycles. Previously taking hormonal patch.  Nexplanon Insertion Procedure Patient identified, informed consent performed, consent signed.   Patient does understand that irregular bleeding is a very common side effect of this medication.  Pregnancy test in clinic today was negative.  Appropriate time out taken.  Patient's left arm was prepped and draped in the usual sterile fashion. The ruler used to measure and mark insertion area.  Patient was prepped with alcohol swab and then injected with 3 ml of 2% lidocaine.  She was prepped with betadine, Nexplanon removed from packaging,  Device confirmed in needle, then inserted full length of needle and withdrawn per handbook instructions. Nexplanon was able to palpated in the patient's arm; patient palpated the insert herself. There was minimal blood loss.  Patient insertion site covered with guaze and a pressure bandage to reduce any bruising.  The patient tolerated the procedure well and was given post procedure instructions.    Exp: 05/03/21 Lot: [id number]   Rubie Maid, MD [facility]

## 2019-06-13 ENCOUNTER — Telehealth: Payer: Self-pay

## 2019-06-13 NOTE — Telephone Encounter (Signed)
CONFIRMED AND SCREENED FOR 06-17-19 OV. 

## 2019-06-17 ENCOUNTER — Encounter: Payer: Self-pay | Admitting: Adult Health

## 2019-06-17 ENCOUNTER — Other Ambulatory Visit: Payer: Self-pay

## 2019-06-17 ENCOUNTER — Ambulatory Visit: Payer: Medicaid Other | Admitting: Adult Health

## 2019-06-17 VITALS — BP 128/81 | HR 96 | Temp 97.0°F | Resp 16 | Ht 65.0 in | Wt 180.0 lb

## 2019-06-17 DIAGNOSIS — F419 Anxiety disorder, unspecified: Secondary | ICD-10-CM

## 2019-06-17 DIAGNOSIS — R000 Tachycardia, unspecified: Secondary | ICD-10-CM

## 2019-06-17 DIAGNOSIS — Z7689 Persons encountering health services in other specified circumstances: Secondary | ICD-10-CM

## 2019-06-17 DIAGNOSIS — F329 Major depressive disorder, single episode, unspecified: Secondary | ICD-10-CM | POA: Diagnosis not present

## 2019-06-17 DIAGNOSIS — F32A Depression, unspecified: Secondary | ICD-10-CM

## 2019-06-17 MED ORDER — SERTRALINE HCL 100 MG PO TABS: 100.0000 mg | ORAL_TABLET | Freq: Every day | ORAL | 0 refills | Status: AC

## 2019-06-17 NOTE — Progress Notes (Signed)
[facility] [place], [place] [address]  Internal MEDICINE  Office Visit Note  Patient Name: Heather Cross  [id number]  [id number]  Date of Service: 06/17/2019   Complaints/HPI Pt is here for establishment of PCP. Chief Complaint  Patient presents with  . New Patient (Initial Visit)    discuss for BP and diabetic  . Anxiety   HPI Pt is here today to establish care. No PCP as of recently.  She is a well appearing 34 yo female. She is a native of [place].  She has 3 kids ages 75, 81, and 15 years old.  She is currently in the process of divorcing her first husband, and is engaged to a new partner at this time. She give plasma at [facility] plasma and has been told on a few occasions that her blood pressure is elevated.  She gives examples of it being "high" at 111, 120 etc.  I believe she means her heart rate. Her heart rate was initially 104 on arrival.  She reports she had a tubal ligation in 2019, and currently has a nexplanon as well.     Current Medication: Outpatient Encounter Medications as of 06/17/2019  Medication Sig  . [DISCONTINUED] sertraline (ZOLOFT) 100 MG tablet Take 100 mg by mouth daily.  . sertraline (ZOLOFT) 100 MG tablet Take 1 tablet (100 mg total) by mouth daily.   Facility-Administered Encounter Medications as of 06/17/2019  Medication  . etonogestrel (NEXPLANON) implant 68 mg    Surgical History: Past Surgical History:  Procedure Laterality Date  . CESAREAN SECTION    . CESAREAN SECTION N/A 07/04/2016   Procedure: CESAREAN SECTION/Boy, 8lbs, 1 oz;  Surgeon: Rubie Maid, MD;  Location: [facility] ORS;  Service: Obstetrics;  Laterality: N/A;  . CESAREAN SECTION WITH BILATERAL TUBAL LIGATION Bilateral 02/02/2018   Procedure: CESAREAN SECTION WITH BILATERAL TUBAL LIGATION;  Surgeon: Rubie Maid, MD;  Location: [facility] ORS;  Service: Obstetrics;  Laterality: Bilateral;  . EAR FOREIGN BODY REMOVAL     63 rocks removed from ear canal.  surgery x3    Medical History: Past Medical History:  Diagnosis Date  . Anxiety   . Depression   . H/O gonorrhea 09/2015   treated 11/2015    Family History: Family History  Problem Relation Age of Onset  . Hypertension Mother   . Glaucoma Father   . Heart disease Maternal Grandmother        heart attack  . Cancer Neg Hx   . Diabetes Neg Hx     Social History   Socioeconomic History  . Marital status: Single    Spouse name: Not on file  . Number of children: Not on file  . Years of education: Not on file  . Highest education level: Not on file  Occupational History  . Occupation: stay at home  Tobacco Use  . Smoking status: Former Smoker    Years: 1.00    Types: Cigarettes    Quit date: 2012    Years since quitting: 9.0  . Smokeless tobacco: Never Used  . Tobacco comment: 1-2 cig/day  Substance and Sexual Activity  . Alcohol use: Not Currently    Alcohol/week: 0.0 standard drinks  . Drug use: No  . Sexual activity: Yes    Partners: Male    Birth control/protection: None  Other Topics Concern  . Not on file  Social History Narrative  . Not on file   Social Determinants of Health   Financial  Resource Strain:   . Difficulty of Paying Living Expenses: Not on file  Food Insecurity:   . Worried About Charity fundraiser in the Last Year: Not on file  . Ran Out of Food in the Last Year: Not on file  Transportation Needs:   . Lack of Transportation (Medical): Not on file  . Lack of Transportation (Non-Medical): Not on file  Physical Activity:   . Days of Exercise per Week: Not on file  . Minutes of Exercise per Session: Not on file  Stress:   . Feeling of Stress : Not on file  Social Connections:   . Frequency of Communication with Friends and Family: Not on file  . Frequency of Social Gatherings with Friends and Family: Not on file  . Attends Religious Services: Not on file  . Active Member of Clubs or Organizations: Not on file  . Attends Theatre manager Meetings: Not on file  . Marital Status: Not on file  Intimate Partner Violence:   . Fear of Current or Ex-Partner: Not on file  . Emotionally Abused: Not on file  . Physically Abused: Not on file  . Sexually Abused: Not on file     Review of Systems  Constitutional: Negative for chills, fatigue and unexpected weight change.  HENT: Negative for congestion, rhinorrhea, sneezing and sore throat.   Eyes: Negative for photophobia, pain and redness.  Respiratory: Negative for cough, chest tightness and shortness of breath.   Cardiovascular: Negative for chest pain and palpitations.  Gastrointestinal: Negative for abdominal pain, constipation, diarrhea, nausea and vomiting.  Endocrine: Negative.   Genitourinary: Negative for dysuria and frequency.  Musculoskeletal: Negative for arthralgias, back pain, joint swelling and neck pain.  Skin: Negative for rash.  Allergic/Immunologic: Negative.   Neurological: Negative for tremors and numbness.  Hematological: Negative for adenopathy. Does not bruise/bleed easily.  Psychiatric/Behavioral: Negative for behavioral problems and sleep disturbance. The patient is not nervous/anxious.     Vital Signs: BP 128/81   Pulse 96   Temp (!) 97 F (36.1 C)   Resp 16   Ht 5\' 5"  (1.651 m)   Wt 180 lb (81.6 kg)   SpO2 99%   BMI 29.95 kg/m    Physical Exam Vitals and nursing note reviewed.  Constitutional:      General: She is not in acute distress.    Appearance: She is well-developed. She is not diaphoretic.  HENT:     Head: Normocephalic and atraumatic.     Mouth/Throat:     Pharynx: No oropharyngeal exudate.  Eyes:     Pupils: Pupils are equal, round, and reactive to light.  Neck:     Thyroid: No thyromegaly.     Vascular: No JVD.     Trachea: No tracheal deviation.  Cardiovascular:     Rate and Rhythm: Normal rate and regular rhythm.     Heart sounds: Normal heart sounds. No murmur. No friction rub. No gallop.    Pulmonary:     Effort: Pulmonary effort is normal. No respiratory distress.     Breath sounds: Normal breath sounds. No wheezing or rales.  Chest:     Chest wall: No tenderness.  Abdominal:     Palpations: Abdomen is soft.     Tenderness: There is no abdominal tenderness. There is no guarding.  Musculoskeletal:        General: Normal range of motion.     Cervical back: Normal range of motion and neck supple.  Lymphadenopathy:  Cervical: No cervical adenopathy.  Skin:    General: Skin is warm and dry.  Neurological:     Mental Status: She is alert and oriented to person, place, and time.     Cranial Nerves: No cranial nerve deficit.  Psychiatric:        Behavior: Behavior normal.        Thought Content: Thought content normal.        Judgment: Judgment normal.    Assessment/Plan: 1. Anxiety and depression Continue zoloft at this time as before.  Continue to monitor.  She has an appt for counseling starting tomorrow.   2. Encounter to establish care with new doctor Baseline labs.  - CBC with Differential/Platelet - Lipid Panel With LDL/HDL Ratio - TSH - T4, free - Comprehensive metabolic panel - HgB [id number]  3. Tachycardia, unspecified Recheck it was 96, will continue to follow.   General Counseling: christopher giang understanding of the findings of todays visit and agrees with plan of treatment. I have discussed any further diagnostic evaluation that may be needed or ordered today. We also reviewed her medications today. she has been encouraged to call the office with any questions or concerns that should arise related to todays visit.  Orders Placed This Encounter  Procedures  . CBC with Differential/Platelet  . Lipid Panel With LDL/HDL Ratio  . TSH  . T4, free  . Comprehensive metabolic panel  . HgB A1c    Meds ordered this encounter  Medications  . sertraline (ZOLOFT) 100 MG tablet    Sig: Take 1 tablet (100 mg total) by mouth daily.    Dispense:  90  tablet    Refill:  0    Time spent: 30 Minutes   This patient was seen by Orson Gear AGNP-C in Collaboration with Dr Lavera Guise as a part of collaborative care agreement  Kendell Bane AGNP-C Internal Medicine

## 2019-07-08 ENCOUNTER — Encounter: Payer: Medicaid Other | Admitting: Obstetrics and Gynecology

## 2019-07-09 ENCOUNTER — Encounter: Payer: Medicaid Other | Admitting: Obstetrics and Gynecology

## 2019-07-24 ENCOUNTER — Telehealth: Payer: Self-pay

## 2019-07-24 NOTE — Telephone Encounter (Signed)
Called lmom informing patient of appointment on 07/29/2019. klh

## 2019-07-29 ENCOUNTER — Ambulatory Visit: Payer: Medicaid Other | Admitting: Adult Health

## 2019-07-29 ENCOUNTER — Telehealth: Payer: Self-pay

## 2019-07-29 NOTE — Telephone Encounter (Signed)
Patient rescheduled appointment on 07/29/2019 to 08/06/2019. klh

## 2019-08-01 ENCOUNTER — Telehealth: Payer: Self-pay

## 2019-08-01 NOTE — Telephone Encounter (Signed)
Patient rescheduled appointment on 08/06/2019 to 08/20/2019 due to patients daughter in hospital. klh

## 2019-08-06 ENCOUNTER — Ambulatory Visit: Payer: Medicaid Other | Admitting: Adult Health

## 2019-08-16 ENCOUNTER — Telehealth: Payer: Self-pay

## 2019-08-16 NOTE — Telephone Encounter (Signed)
CONFIRMED AND SCREENED FOR 08-20-19 OV. 

## 2019-08-20 ENCOUNTER — Ambulatory Visit: Payer: Medicaid Other | Admitting: Adult Health

## 2019-08-23 ENCOUNTER — Telehealth: Payer: Self-pay

## 2019-08-23 NOTE — Telephone Encounter (Signed)
BILLED MISSED APPOINTMENT FEE 08/20/19

## 2019-08-30 ENCOUNTER — Encounter: Payer: Self-pay | Admitting: Emergency Medicine

## 2019-08-30 ENCOUNTER — Emergency Department: Payer: Medicaid Other

## 2019-08-30 ENCOUNTER — Emergency Department
Admission: EM | Admit: 2019-08-30 | Discharge: 2019-08-30 | Disposition: A | Discharge status: Home / Self Care | Payer: Medicaid Other | Attending: Emergency Medicine | Admitting: Emergency Medicine

## 2019-08-30 ENCOUNTER — Other Ambulatory Visit: Payer: Self-pay

## 2019-08-30 DIAGNOSIS — Z87891 Personal history of nicotine dependence: Secondary | ICD-10-CM | POA: Insufficient documentation

## 2019-08-30 DIAGNOSIS — R109 Unspecified abdominal pain: Secondary | ICD-10-CM

## 2019-08-30 LAB — COMPREHENSIVE METABOLIC PANEL
ALT: 31 U/L (ref 0–44)
AST: 23 U/L (ref 15–41)
Albumin: 4.5 g/dL (ref 3.5–5.0)
Alkaline Phosphatase: 70 U/L (ref 38–126)
Anion gap: 9 (ref 5–15)
BUN: 10 mg/dL (ref 6–20)
CO2: 23 mmol/L (ref 22–32)
Calcium: 9.6 mg/dL (ref 8.9–10.3)
Chloride: 108 mmol/L (ref 98–111)
Creatinine, Ser: 0.65 mg/dL (ref 0.44–1.00)
GFR calc Af Amer: >60 mL/min (ref >60)
GFR calc non Af Amer: >60 mL/min (ref >60)
Glucose, Bld: 115 mg/dL — ABNORMAL HIGH (ref 70–99)
Potassium: 3.7 mmol/L (ref 3.5–5.1)
Sodium: 140 mmol/L (ref 135–145)
Total Bilirubin: 0.4 mg/dL (ref 0.3–1.2)
Total Protein: 7.9 g/dL (ref 6.5–8.1)

## 2019-08-30 LAB — URINALYSIS, COMPLETE (UACMP) WITH MICROSCOPIC
Bilirubin Urine: NEGATIVE
Glucose, UA: NEGATIVE mg/dL
Hgb urine dipstick: NEGATIVE
Ketones, ur: NEGATIVE mg/dL
Nitrite: NEGATIVE
Protein, ur: NEGATIVE mg/dL
Specific Gravity, Urine: 1.027 (ref 1.005–1.030)
pH: 5 (ref 5.0–8.0)

## 2019-08-30 LAB — CBC
HCT: 43.5 % (ref 36.0–46.0)
Hemoglobin: 14.2 g/dL (ref 12.0–15.0)
MCH: 26.2 pg (ref 26.0–34.0)
MCHC: 32.6 g/dL (ref 30.0–36.0)
MCV: 80.1 fL (ref 80.0–100.0)
Platelets: 358 10*3/uL (ref 150–400)
RBC: 5.43 MIL/uL — ABNORMAL HIGH (ref 3.87–5.11)
RDW: 14.1 % (ref 11.5–15.5)
WBC: 10.1 10*3/uL (ref 4.0–10.5)
nRBC: 0 % (ref 0.0–0.2)

## 2019-08-30 LAB — LIPASE, BLOOD: Lipase: 34 U/L (ref 11–51)

## 2019-08-30 LAB — POCT PREGNANCY, URINE: Preg Test, Ur: NEGATIVE

## 2019-08-30 MED ORDER — SODIUM CHLORIDE 0.9% FLUSH: 3.0000 mL | Freq: Once | INTRAVENOUS | Status: DC

## 2019-08-30 MED ORDER — CEPHALEXIN 500 MG PO CAPS: 500.0000 mg | ORAL_CAPSULE | Freq: Four times a day (QID) | ORAL | 0 refills | Status: AC

## 2019-08-30 NOTE — ED Notes (Signed)
Dr Williams at bedside 
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
## 2019-08-30 NOTE — ED Triage Notes (Signed)
Pt to ED via POV for abdominal pain x 1-2 months. Pt states that she has also had a lump on the back of her neck x 1 month. Pt went to Urgent care and was sent to ED. Pt is in NAD.

## 2019-08-31 LAB — PREGNANCY, URINE: Preg Test, Ur: NEGATIVE

## 2019-09-01 LAB — URINE CULTURE

## 2019-09-20 ENCOUNTER — Encounter: Payer: Medicaid Other | Admitting: Obstetrics and Gynecology

## 2019-10-23 ENCOUNTER — Encounter: Payer: Medicaid Other | Admitting: Obstetrics and Gynecology

## 2020-02-10 ENCOUNTER — Emergency Department: Payer: Medicaid Other

## 2020-02-10 ENCOUNTER — Other Ambulatory Visit: Payer: Self-pay

## 2020-02-10 DIAGNOSIS — Z20822 Contact with and (suspected) exposure to covid-19: Secondary | ICD-10-CM | POA: Insufficient documentation

## 2020-02-10 DIAGNOSIS — R079 Chest pain, unspecified: Secondary | ICD-10-CM | POA: Diagnosis not present

## 2020-02-10 DIAGNOSIS — Z5321 Procedure and treatment not carried out due to patient leaving prior to being seen by health care provider: Secondary | ICD-10-CM | POA: Insufficient documentation

## 2020-02-10 DIAGNOSIS — R42 Dizziness and giddiness: Secondary | ICD-10-CM | POA: Insufficient documentation

## 2020-02-10 LAB — CBC
HCT: 42.5 % (ref 36.0–46.0)
Hemoglobin: 14.7 g/dL (ref 12.0–15.0)
MCH: 28.9 pg (ref 26.0–34.0)
MCHC: 34.6 g/dL (ref 30.0–36.0)
MCV: 83.7 fL (ref 80.0–100.0)
Platelets: 299 10*3/uL (ref 150–400)
RBC: 5.08 MIL/uL (ref 3.87–5.11)
RDW: 13 % (ref 11.5–15.5)
WBC: 12 10*3/uL — ABNORMAL HIGH (ref 4.0–10.5)
nRBC: 0 % (ref 0.0–0.2)

## 2020-02-10 LAB — BASIC METABOLIC PANEL
Anion gap: 9 (ref 5–15)
BUN: 12 mg/dL (ref 6–20)
CO2: 23 mmol/L (ref 22–32)
Calcium: 9.4 mg/dL (ref 8.9–10.3)
Chloride: 107 mmol/L (ref 98–111)
Creatinine, Ser: 0.76 mg/dL (ref 0.44–1.00)
GFR calc Af Amer: >60 mL/min (ref >60)
GFR calc non Af Amer: >60 mL/min (ref >60)
Glucose, Bld: 139 mg/dL — ABNORMAL HIGH (ref 70–99)
Potassium: 3.9 mmol/L (ref 3.5–5.1)
Sodium: 139 mmol/L (ref 135–145)

## 2020-02-10 LAB — TROPONIN I (HIGH SENSITIVITY)
Troponin I (High Sensitivity): 3 ng/L (ref <18)
Troponin I (High Sensitivity): 3 ng/L (ref <18)

## 2020-02-10 NOTE — ED Triage Notes (Signed)
PT to ED c/o chest pain since 9am that has gotten progressively worse. No cardiac hx. PT endorses 1 episode dizziness with the pain, denies radiation of pain and vomiting. No cough or fever.

## 2020-02-11 ENCOUNTER — Emergency Department
Admission: EM | Admit: 2020-02-11 | Discharge: 2020-02-11 | Disposition: A | Discharge status: Home / Self Care | Payer: Medicaid Other | Attending: Emergency Medicine | Admitting: Emergency Medicine

## 2020-02-11 ENCOUNTER — Telehealth: Payer: Self-pay | Admitting: Emergency Medicine

## 2020-02-11 LAB — SARS CORONAVIRUS 2 BY RT PCR (HOSPITAL ORDER, PERFORMED IN ~~LOC~~ HOSPITAL LAB): SARS Coronavirus 2: NEGATIVE

## 2020-02-11 LAB — FIBRIN DERIVATIVES D-DIMER (ARMC ONLY): Fibrin derivatives D-dimer (ARMC): 266 ng/mL (FEU) (ref 0.00–499.00)

## 2020-02-11 NOTE — ED Notes (Signed)
No answer when called several times from lobby & outside 

## 2020-02-11 NOTE — Telephone Encounter (Addendum)
Called patient due to lwot to inquire about condition and follow up plans. Left message. \ Patient called me back.  She does not feel any better.  She does have pcp and agrees to call her pcp and have them review her labs/xray and ekg.

## 2020-02-11 NOTE — ED Notes (Addendum)
No answer when called several times from lobby & outside 

## 2020-03-03 ENCOUNTER — Encounter: Payer: Medicaid Other | Admitting: Obstetrics and Gynecology

## 2020-03-10 ENCOUNTER — Ambulatory Visit (INDEPENDENT_AMBULATORY_CARE_PROVIDER_SITE_OTHER): Payer: Medicaid Other | Admitting: Obstetrics and Gynecology

## 2020-03-10 ENCOUNTER — Other Ambulatory Visit: Payer: Self-pay

## 2020-03-10 ENCOUNTER — Encounter: Payer: Self-pay | Admitting: Obstetrics and Gynecology

## 2020-03-10 VITALS — BP 121/75 | HR 108 | Ht 65.0 in | Wt 177.3 lb

## 2020-03-10 DIAGNOSIS — N644 Mastodynia: Secondary | ICD-10-CM

## 2020-03-10 DIAGNOSIS — R1032 Left lower quadrant pain: Secondary | ICD-10-CM

## 2020-03-10 NOTE — Progress Notes (Signed)
Pt present due to having breast pain. Pt c/o of pain in both breast x 4 months along with right lower abd/pelvic pain.

## 2020-03-10 NOTE — Patient Instructions (Addendum)
Round Ligament Pain  The round ligament is a cord of muscle and tissue that helps support the uterus. It can become a source of pain during pregnancy if it becomes stretched or twisted as the baby grows. The pain usually begins in the second trimester (13-28 weeks) of pregnancy, and it can come and go until the baby is delivered. It is not a serious problem, and it does not cause harm to the baby. Round ligament pain is usually a short, sharp, and pinching pain, but it can also be a dull, lingering, and aching pain. The pain is felt in the lower side of the abdomen or in the groin. It usually starts deep in the groin and moves up to the outside of the hip area. The pain may occur when you:  Suddenly change position, such as quickly going from a sitting to standing position.  Roll over in bed.  Cough or sneeze.  Do physical activity. Follow these instructions at home:   Watch your condition for any changes.  When the pain starts, relax. Then try any of these methods to help with the pain: ? Sitting down. ? Flexing your knees up to your abdomen. ? Lying on your side with one pillow under your abdomen and another pillow between your legs. ? Sitting in a warm bath for 15-20 minutes or until the pain goes away.  Take over-the-counter and prescription medicines only as told by your health care provider.  Move slowly when you sit down or stand up.  Avoid long walks if they cause pain.  Stop or reduce your physical activities if they cause pain.  Keep all follow-up visits as told by your health care provider. This is important. Contact a health care provider if:  Your pain does not go away with treatment.  You feel pain in your back that you did not have before.  Your medicine is not helping. Get help right away if:  You have a fever or chills.  You develop uterine contractions.  You have vaginal bleeding.  You have nausea or vomiting.  You have diarrhea.  You have pain  when you urinate. Summary  Round ligament pain is felt in the lower abdomen or groin. It is usually a short, sharp, and pinching pain. It can also be a dull, lingering, and aching pain.  This pain usually begins in the second trimester (13-28 weeks). It occurs because the uterus is stretching with the growing baby, and it is not harmful to the baby.  You may notice the pain when you suddenly change position, when you cough or sneeze, or during physical activity.  Relaxing, flexing your knees to your abdomen, lying on one side, or taking a warm bath may help to get rid of the pain.  Get help from your health care provider if the pain does not go away or if you have vaginal bleeding, nausea, vomiting, diarrhea, or painful urination. This information is not intended to replace advice given to you by your health care provider. Make sure you discuss any questions you have with your health care provider. Document Revised: 11/08/2017 Document Reviewed: 11/08/2017 Elsevier Patient Education  [place].   Breast Tenderness Breast tenderness is a common problem for women of all ages, but may also occur in men. Breast tenderness may range from mild discomfort to severe pain. In women, the pain usually comes and goes with the menstrual cycle, but it can also be constant. Breast tenderness has many possible causes, including  hormone changes, infections, and taking certain medicines. You may have tests, such as a mammogram or an ultrasound, to check for any unusual findings. Having breast tenderness usually does not mean that you have breast cancer. Follow these instructions at home: Managing pain and discomfort   If directed, put ice to the painful area. To do this: ? Put ice in a plastic bag. ? Place a towel between your skin and the bag. ? Leave the ice on for 20 minutes, 2-3 times a day.  Wear a supportive bra, especially during exercise. You may also want to wear a supportive bra while  sleeping if your breasts are very tender. Medicines  Take over-the-counter and prescription medicines only as told by your health care provider. If the cause of your pain is infection, you may be prescribed an antibiotic medicine.  If you were prescribed an antibiotic, take it as told by your health care provider. Do not stop taking the antibiotic even if you start to feel better. Eating and drinking  Your health care provider may recommend that you lessen the amount of fat in your diet. You can do this by: ? Limiting fried foods. ? Cooking foods using methods such as baking, boiling, grilling, and broiling.  Decrease the amount of caffeine in your diet. Instead, drink more water and choose caffeine-free drinks. General instructions   Keep a log of the days and times when your breasts are most tender.  Ask your health care provider how to do breast exams at home. This will help you notice if you have an unusual growth or lump.  Keep all follow-up visits as told by your health care provider. This is important. Contact a health care provider if:  Any part of your breast is hard, red, and hot to the touch. This may be a sign of infection.  You are a woman and: ? Not breastfeeding and you have fluid, especially blood or pus, coming out of your nipples. ? Have a new or painful lump in your breast that remains after your menstrual period ends.  You have a fever.  Your pain does not improve or it gets worse.  Your pain is interfering with your daily activities. Summary  Breast tenderness may range from mild discomfort to severe pain.  Breast tenderness has many possible causes, including hormone changes, infections, and taking certain medicines.  It can be treated with ice, wearing a supportive bra, and medicines.  Make changes to your diet if told to by your health care provider. This information is not intended to replace advice given to you by your health care provider. Make  sure you discuss any questions you have with your health care provider. Document Revised: 10/15/2018 Document Reviewed: 10/15/2018 Elsevier Patient Education  [place].

## 2020-03-10 NOTE — Progress Notes (Signed)
    GYNECOLOGY PROGRESS NOTE  Subjective:    Patient ID: Heather Cross, female    DOB: May 01, 1986, 34 y.o.   MRN: [id number]  HPI  Patient is a 34 y.o. [id number] female who presents for complaints of bilateral breast pain and heaviness for ~ 4 months. Also noting some left groin pain, intermittent.  Gets so bad to the point she feels like she can't walk.  The pain lasts for days when it occurs.  Currently has an Nexplanon in place for management of menstrual cycles (also has had BTL for contraception).   The following portions of the patient's history were reviewed and updated as appropriate: allergies, current medications, past family history, past medical history, past social history, past surgical history and problem list.  Review of Systems Pertinent items noted in HPI and remainder of comprehensive ROS otherwise negative.   Objective:   Blood pressure 121/75, pulse (!) 108, height 5\' 5"  (1.651 m), weight 177 lb 4.8 oz (80.4 kg), currently breastfeeding. General appearance: alert and no distress Breasts: breasts appear normal, no suspicious masses, no skin or nipple changes or axillary nodes.  Mildly tender bilaterally.  Pelvis: External genitalia normal. Left groin region with no palpable cords or nodules, mild tenderness, leg with normal range of motion.   Assessment:   Breast tenderness Left groin pain  Plan:   1.  Breast tenderness - likely secondary to hormone use (Nexplanon).  No palpable masses noted. Discussed conservative management (i.e. use of ice packs and NSAIDs).  Could also consider short-term supplementation with estrogen to balance out the effects of the progesterone, however patient may begin to experience breakthrough bleeding if used.  Patient will do conservative measures for now. 2. Left groin pain -likely due to to overuse, such as ending squatting heavy lifting (lifts her small children often), discussed stretches and exercises for relief.   Return if  symptoms worsen or fail to improve.    Rubie Maid, MD [facility]

## 2020-03-12 ENCOUNTER — Encounter: Payer: Self-pay | Admitting: Obstetrics and Gynecology

## 2020-03-26 ENCOUNTER — Ambulatory Visit: Payer: Self-pay | Admitting: *Deleted

## 2020-03-26 NOTE — Telephone Encounter (Signed)
Call dropped during transfer.  Attempted to reach pt, line busy.

## 2020-05-08 ENCOUNTER — Encounter: Payer: Medicaid Other | Admitting: Obstetrics and Gynecology

## 2020-06-26 ENCOUNTER — Encounter: Payer: Medicaid Other | Admitting: Obstetrics and Gynecology

## 2020-07-14 ENCOUNTER — Encounter: Payer: Medicaid Other | Admitting: Obstetrics and Gynecology

## 2020-10-08 ENCOUNTER — Encounter: Payer: Medicaid Other | Admitting: Obstetrics and Gynecology

## 2020-10-09 ENCOUNTER — Ambulatory Visit (INDEPENDENT_AMBULATORY_CARE_PROVIDER_SITE_OTHER): Payer: Medicaid Other | Admitting: Obstetrics and Gynecology

## 2020-10-09 ENCOUNTER — Encounter: Payer: Self-pay | Admitting: Obstetrics and Gynecology

## 2020-10-09 ENCOUNTER — Other Ambulatory Visit: Payer: Self-pay

## 2020-10-09 VITALS — BP 137/73 | HR 87 | Ht 65.0 in | Wt 186.0 lb

## 2020-10-09 DIAGNOSIS — N760 Acute vaginitis: Secondary | ICD-10-CM

## 2020-10-09 DIAGNOSIS — N898 Other specified noninflammatory disorders of vagina: Secondary | ICD-10-CM | POA: Diagnosis not present

## 2020-10-09 DIAGNOSIS — R8290 Unspecified abnormal findings in urine: Secondary | ICD-10-CM

## 2020-10-09 DIAGNOSIS — B9689 Other specified bacterial agents as the cause of diseases classified elsewhere: Secondary | ICD-10-CM

## 2020-10-09 LAB — POCT URINALYSIS DIPSTICK
Bilirubin, UA: NEGATIVE
Glucose, UA: NEGATIVE
Ketones, UA: NEGATIVE
Leukocytes, UA: NEGATIVE
Nitrite, UA: NEGATIVE
Protein, UA: NEGATIVE
Spec Grav, UA: 1.01 (ref 1.010–1.025)
Urobilinogen, UA: 0.2 E.U./dL
pH, UA: 7 (ref 5.0–8.0)

## 2020-10-09 MED ORDER — METRONIDAZOLE 500 MG PO TABS: 500.0000 mg | ORAL_TABLET | Freq: Two times a day (BID) | ORAL | 0 refills | Status: AC
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
## 2020-10-09 NOTE — Addendum Note (Signed)
Addended by: Durwin Glaze on: 10/09/2020 11:24 AM   Modules accepted: Orders

## 2020-10-12 ENCOUNTER — Encounter: Payer: Self-pay | Admitting: Obstetrics and Gynecology

## 2020-10-12 ENCOUNTER — Telehealth: Payer: Self-pay | Admitting: Obstetrics and Gynecology

## 2020-10-12 LAB — URINE CULTURE

## 2020-10-12 NOTE — Telephone Encounter (Signed)
New Message:  Pt wants to discuss lab results.

## 2020-10-12 NOTE — Telephone Encounter (Signed)
Please advise. Thanks Destanie Tibbetts 

## 2020-10-12 NOTE — Telephone Encounter (Signed)
Pt called no answer LM via VM that DJE was not in the office today. Informed pt Via VM that someone would contact her on tomorrow to discuss her labs and to please allow them 48-72 hours to reply.

## 2020-10-13 NOTE — Telephone Encounter (Signed)
Please see mychart message.

## 2020-10-23 ENCOUNTER — Encounter: Payer: Medicaid Other | Admitting: Obstetrics and Gynecology

## 2020-10-30 ENCOUNTER — Encounter: Payer: Medicaid Other | Admitting: Obstetrics and Gynecology

## 2020-10-30 DIAGNOSIS — Z01419 Encounter for gynecological examination (general) (routine) without abnormal findings: Secondary | ICD-10-CM

## 2020-11-05 ENCOUNTER — Encounter: Payer: Self-pay | Admitting: Obstetrics and Gynecology

## 2020-11-05 ENCOUNTER — Other Ambulatory Visit: Payer: Self-pay

## 2020-11-05 ENCOUNTER — Ambulatory Visit (INDEPENDENT_AMBULATORY_CARE_PROVIDER_SITE_OTHER): Payer: Medicaid Other | Admitting: Obstetrics and Gynecology

## 2020-11-05 VITALS — BP 123/81 | HR 103 | Ht 65.0 in | Wt 185.6 lb

## 2020-11-05 DIAGNOSIS — Z01419 Encounter for gynecological examination (general) (routine) without abnormal findings: Secondary | ICD-10-CM | POA: Diagnosis not present

## 2020-11-05 DIAGNOSIS — Z13220 Encounter for screening for lipoid disorders: Secondary | ICD-10-CM

## 2020-11-05 DIAGNOSIS — Z131 Encounter for screening for diabetes mellitus: Secondary | ICD-10-CM | POA: Diagnosis not present

## 2020-11-05 MED ORDER — ESTRADIOL 1 MG PO TABS: 1.0000 mg | ORAL_TABLET | Freq: Every day | ORAL | 0 refills | Status: AC

## 2020-11-05 NOTE — Patient Instructions (Signed)
Preventive Care 21-34 Years Old, Female Preventive care refers to lifestyle choices and visits with your health care provider that can promote health and wellness. This includes:  A yearly physical exam. This is also called an annual wellness visit.  Regular dental and eye exams.  Immunizations.  Screening for certain conditions.  Healthy lifestyle choices, such as: ? Eating a healthy diet. ? Getting regular exercise. ? Not using drugs or products that contain nicotine and tobacco. ? Limiting alcohol use. What can I expect for my preventive care visit? Physical exam Your health care provider may check your:  Height and weight. These may be used to calculate your BMI (body mass index). BMI is a measurement that tells if you are at a healthy weight.  Heart rate and blood pressure.  Body temperature.  Skin for abnormal spots. Counseling Your health care provider may ask you questions about your:  Past medical problems.  Family's medical history.  Alcohol, tobacco, and drug use.  Emotional well-being.  Home life and relationship well-being.  Sexual activity.  Diet, exercise, and sleep habits.  Work and work environment.  Access to firearms.  Method of birth control.  Menstrual cycle.  Pregnancy history. What immunizations do I need? Vaccines are usually given at various ages, according to a schedule. Your health care provider will recommend vaccines for you based on your age, medical history, and lifestyle or other factors, such as travel or where you work.   What tests do I need? Blood tests  Lipid and cholesterol levels. These may be checked every 5 years starting at age 20.  Hepatitis C test.  Hepatitis B test. Screening  Diabetes screening. This is done by checking your blood sugar (glucose) after you have not eaten for a while (fasting).  STD (sexually transmitted disease) testing, if you are at risk.  BRCA-related cancer screening. This may be  done if you have a family history of breast, ovarian, tubal, or peritoneal cancers.  Pelvic exam and Pap test. This may be done every 3 years starting at age 21. Starting at age 30, this may be done every 5 years if you have a Pap test in combination with an HPV test. Talk with your health care provider about your test results, treatment options, and if necessary, the need for more tests.   Follow these instructions at home: Eating and drinking  Eat a healthy diet that includes fresh fruits and vegetables, whole grains, lean protein, and low-fat dairy products.  Take vitamin and mineral supplements as recommended by your health care provider.  Do not drink alcohol if: ? Your health care provider tells you not to drink. ? You are pregnant, may be pregnant, or are planning to become pregnant.  If you drink alcohol: ? Limit how much you have to 0-1 drink a day. ? Be aware of how much alcohol is in your drink. In the [place] one drink equals one 12 oz bottle of beer (355 mL), one 5 oz glass of wine (148 mL), or one 1 oz glass of hard liquor (44 mL).   Lifestyle  Take daily care of your teeth and gums. Brush your teeth every morning and night with fluoride toothpaste. Floss one time each day.  Stay active. Exercise for at least 30 minutes 5 or more days each week.  Do not use any products that contain nicotine or tobacco, such as cigarettes, e-cigarettes, and chewing tobacco. If you need help quitting, ask your health care provider.  Do not   use drugs.  If you are sexually active, practice safe sex. Use a condom or other form of protection to prevent STIs (sexually transmitted infections).  If you do not wish to become pregnant, use a form of birth control. If you plan to become pregnant, see your health care provider for a prepregnancy visit.  Find healthy ways to cope with stress, such as: ? Meditation, yoga, or listening to music. ? Journaling. ? Talking to a trusted  person. ? Spending time with friends and family. Safety  Always wear your seat belt while driving or riding in a vehicle.  Do not drive: ? If you have been drinking alcohol. Do not ride with someone who has been drinking. ? When you are tired or distracted. ? While texting.  Wear a helmet and other protective equipment during sports activities.  If you have firearms in your house, make sure you follow all gun safety procedures.  Seek help if you have been physically or sexually abused. What's next?  Go to your health care provider once a year for an annual wellness visit.  Ask your health care provider how often you should have your eyes and teeth checked.  Stay up to date on all vaccines. This information is not intended to replace advice given to you by your health care provider. Make sure you discuss any questions you have with your health care provider. Document Revised: 01/19/2020 Document Reviewed: 02/01/2018 Elsevier Patient Education  2021 [employer] Inc.     Breast Self-Awareness Breast self-awareness means being familiar with how your breasts look and feel. It involves checking your breasts regularly and reporting any changes to your health care provider. Practicing breast self-awareness is important. Sometimes changes may not be harmful (are benign), but sometimes a change in your breasts can be a sign of a serious medical problem. It is important to learn how to do this procedure correctly so that you can catch problems early, when treatment is more likely to be successful. All women should practice breast self-awareness, including women who have had breast implants. What you need:  A mirror.  A well-lit room. How to do a breast self-exam A breast self-exam is one way to learn what is normal for your breasts and whether your breasts are changing. To do a breast self-exam: Look for changes 1. Remove all the clothing above your waist. 2. Stand in front of a mirror  in a room with good lighting. 3. Put your hands on your hips. 4. Push your hands firmly downward. 5. Compare your breasts in the mirror. Look for differences between them (asymmetry), such as: ? Differences in shape. ? Differences in size. ? Puckers, dips, and bumps in one breast and not the other. 6. Look at each breast for changes in the skin, such as: ? Redness. ? Scaly areas. 7. Look for changes in your nipples, such as: ? Discharge. ? Bleeding. ? Dimpling. ? Redness. ? A change in position.   Feel for changes Carefully feel your breasts for lumps and changes. It is best to do this while lying on your back on the floor, and again while sitting or standing in the tub or shower with soapy water on your skin. Feel each breast in the following way: 1. Place the arm on the side of the breast you are examining above your head. 2. Feel your breast with the other hand. 3. Start in the nipple area and make -inch (2 cm) overlapping circles to feel your breast. Use   pads of your three middle fingers to do this. Apply light pressure, then medium pressure, then firm pressure. The light pressure will allow you to feel the tissue closest to the skin. The medium pressure will allow you to feel the tissue that is a little deeper. The firm pressure will allow you to feel the tissue close to the ribs. 4. Continue the overlapping circles, moving downward over the breast until you feel your ribs below your breast. 5. Move one finger-width toward the center of the body. Continue to use the -inch (2 cm) overlapping circles to feel your breast as you move slowly up toward your collarbone. 6. Continue the up-and-down exam using all three pressures until you reach your armpit.   Write down what you find Writing down what you find can help you remember what to discuss with your health care provider. Write down:  What is normal for each breast.  Any changes that you find in each breast, including: ? The kind  of changes you find. ? Any pain or tenderness. ? Size and location of any lumps.  Where you are in your menstrual cycle, if you are still menstruating. General tips and recommendations  Examine your breasts every month.  If you are breastfeeding, the best time to examine your breasts is after a feeding or after using a breast pump.  If you menstruate, the best time to examine your breasts is 5-7 days after your period. Breasts are generally lumpier during menstrual periods, and it may be more difficult to notice changes.  With time and practice, you will become more familiar with the variations in your breasts and more comfortable with the exam. Contact a health care provider if you:  See a change in the shape or size of your breasts or nipples.  See a change in the skin of your breast or nipples, such as a reddened or scaly area.  Have unusual discharge from your nipples.  Find a lump or thick area that was not there before.  Have pain in your breasts.  Have any concerns related to your breast health. Summary  Breast self-awareness includes looking for physical changes in your breasts, as well as feeling for any changes within your breasts.  Breast self-awareness should be performed in front of a mirror in a well-lit room.  You should examine your breasts every month. If you menstruate, the best time to examine your breasts is 5-7 days after your menstrual period.  Let your health care provider know of any changes you notice in your breasts, including changes in size, changes on the skin, pain or tenderness, or unusual fluid from your nipples. This information is not intended to replace advice given to you by your health care provider. Make sure you discuss any questions you have with your health care provider. Document Revised: 01/09/2018 Document Reviewed: 01/09/2018 Elsevier Patient Education  [place].
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
## 2020-11-05 NOTE — Progress Notes (Signed)
Pt present for annual exam. Pt stated that she would like to discuss her weight gain and other gyn issues.  GAD-7=5. PHQ-9=5.

## 2020-11-07 MED ORDER — NYSTATIN 100000 UNIT/GM EX CREA: 1.0000 "application " | TOPICAL_CREAM | Freq: Two times a day (BID) | CUTANEOUS | 0 refills | Status: AC

## 2020-11-09 ENCOUNTER — Other Ambulatory Visit: Payer: Medicaid Other

## 2020-11-16 ENCOUNTER — Other Ambulatory Visit: Payer: Self-pay

## 2020-11-16 ENCOUNTER — Other Ambulatory Visit: Payer: Medicaid Other

## 2020-11-17 LAB — HEMOGLOBIN A1C
Est. average glucose Bld gHb Est-mCnc: 120 mg/dL
Hgb A1c MFr Bld: 5.8 % — ABNORMAL HIGH (ref 4.8–5.6)

## 2020-11-17 LAB — COMPREHENSIVE METABOLIC PANEL
ALT: 29 IU/L (ref 0–32)
AST: 20 IU/L (ref 0–40)
Albumin/Globulin Ratio: 2.3 — ABNORMAL HIGH (ref 1.2–2.2)
Albumin: 3.9 g/dL (ref 3.8–4.8)
Alkaline Phosphatase: 56 IU/L (ref 44–121)
BUN/Creatinine Ratio: 9 (ref 9–23)
BUN: 7 mg/dL (ref 6–20)
Bilirubin Total: 0.2 mg/dL (ref 0.0–1.2)
CO2: 22 mmol/L (ref 20–29)
Calcium: 9 mg/dL (ref 8.7–10.2)
Chloride: 106 mmol/L (ref 96–106)
Creatinine, Ser: 0.75 mg/dL (ref 0.57–1.00)
Globulin, Total: 1.7 g/dL (ref 1.5–4.5)
Glucose: 141 mg/dL — ABNORMAL HIGH (ref 65–99)
Potassium: 4.5 mmol/L (ref 3.5–5.2)
Sodium: 142 mmol/L (ref 134–144)
Total Protein: 5.6 g/dL — ABNORMAL LOW (ref 6.0–8.5)
eGFR: 107 mL/min/{1.73_m2} (ref >59)

## 2020-11-17 LAB — CBC
Hematocrit: 45.5 % (ref 34.0–46.6)
Hemoglobin: 15.4 g/dL (ref 11.1–15.9)
MCH: 29.7 pg (ref 26.6–33.0)
MCHC: 33.8 g/dL (ref 31.5–35.7)
MCV: 88 fL (ref 79–97)
Platelets: 280 10*3/uL (ref 150–450)
RBC: 5.18 x10E6/uL (ref 3.77–5.28)
RDW: 12.3 % (ref 11.7–15.4)
WBC: 8.2 10*3/uL (ref 3.4–10.8)

## 2020-11-17 LAB — LIPID PANEL
Chol/HDL Ratio: 3.5 ratio (ref 0.0–4.4)
Cholesterol, Total: 137 mg/dL (ref 100–199)
HDL: 39 mg/dL — ABNORMAL LOW (ref >39)
LDL Chol Calc (NIH): 80 mg/dL (ref 0–99)
Triglycerides: 98 mg/dL (ref 0–149)
VLDL Cholesterol Cal: 18 mg/dL (ref 5–40)

## 2020-11-17 LAB — HEPATITIS C ANTIBODY: Hep C Virus Ab: <0.1 s/co ratio (ref 0.0–0.9)

## 2021-05-23 ENCOUNTER — Emergency Department: Payer: Medicaid Other

## 2021-05-23 ENCOUNTER — Other Ambulatory Visit: Payer: Self-pay

## 2021-05-23 ENCOUNTER — Emergency Department
Admission: EM | Admit: 2021-05-23 | Discharge: 2021-05-23 | Disposition: A | Discharge status: Home / Self Care | Payer: Medicaid Other | Attending: Emergency Medicine | Admitting: Emergency Medicine

## 2021-05-23 ENCOUNTER — Encounter: Payer: Self-pay | Admitting: Emergency Medicine

## 2021-05-23 DIAGNOSIS — S161XXA Strain of muscle, fascia and tendon at neck level, initial encounter: Secondary | ICD-10-CM | POA: Insufficient documentation

## 2021-05-23 DIAGNOSIS — S0083XA Contusion of other part of head, initial encounter: Secondary | ICD-10-CM | POA: Diagnosis not present

## 2021-05-23 DIAGNOSIS — S0993XA Unspecified injury of face, initial encounter: Secondary | ICD-10-CM | POA: Diagnosis present

## 2021-05-23 DIAGNOSIS — R519 Headache, unspecified: Secondary | ICD-10-CM | POA: Diagnosis not present

## 2021-05-23 DIAGNOSIS — Z87891 Personal history of nicotine dependence: Secondary | ICD-10-CM | POA: Insufficient documentation

## 2021-05-23 DIAGNOSIS — Y92410 Unspecified street and highway as the place of occurrence of the external cause: Secondary | ICD-10-CM | POA: Insufficient documentation

## 2021-05-23 MED ORDER — MELOXICAM 15 MG PO TABS: 15.0000 mg | ORAL_TABLET | Freq: Every day | ORAL | 2 refills | Status: AC

## 2021-05-23 MED ORDER — BACLOFEN 10 MG PO TABS: 10.0000 mg | ORAL_TABLET | Freq: Three times a day (TID) | ORAL | 0 refills | Status: AC
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
## 2021-05-23 NOTE — Discharge Instructions (Signed)
Follow-up with your regular doctor as needed.  Return emergency department worsening. Take Tylenol for pain as needed.  Meloxicam and baclofen as prescribed Apply ice to all areas that hurt.
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
## 2022-09-15 ENCOUNTER — Other Ambulatory Visit (HOSPITAL_COMMUNITY)
Admission: RE | Admit: 2022-09-15 | Discharge: 2022-09-15 | Disposition: A | Discharge status: Home / Self Care | Payer: Medicaid Other | Source: Ambulatory Visit | Attending: Obstetrics and Gynecology | Admitting: Obstetrics and Gynecology

## 2022-09-15 ENCOUNTER — Encounter: Payer: Self-pay | Admitting: Obstetrics & Gynecology

## 2022-09-15 ENCOUNTER — Ambulatory Visit (INDEPENDENT_AMBULATORY_CARE_PROVIDER_SITE_OTHER): Payer: Medicaid Other | Admitting: Obstetrics & Gynecology

## 2022-09-15 VITALS — BP 130/88 | Ht 65.0 in | Wt 180.0 lb

## 2022-09-15 DIAGNOSIS — Z98891 History of uterine scar from previous surgery: Secondary | ICD-10-CM

## 2022-09-15 DIAGNOSIS — Z01419 Encounter for gynecological examination (general) (routine) without abnormal findings: Secondary | ICD-10-CM | POA: Insufficient documentation

## 2022-09-15 DIAGNOSIS — Z3046 Encounter for surveillance of implantable subdermal contraceptive: Secondary | ICD-10-CM

## 2022-09-15 DIAGNOSIS — Z124 Encounter for screening for malignant neoplasm of cervix: Secondary | ICD-10-CM

## 2022-09-15 NOTE — Progress Notes (Signed)
GYNECOLOGY ANNUAL PHYSICAL EXAM PROGRESS NOTE  Subjective:    Heather Cross is a 37 y.o. single [id number] female who presents for an annual exam. The patient has no complaints today. She would like her 36 year old Nexplanon removed. She had a BTL at the time of her cesarean section in 2019.  The patient is sexually active. The patient participates in regular exercise: no. Has the patient ever been transfused or tattooed?:no The patient reports that there is not domestic violence in her life.    Menstrual History: Patient's last menstrual period was 08/24/2022. Period Cycle (Days): 28 Period Duration (Days): 21 Period Pattern: Regular Menstrual Flow: Heavy, Moderate, Light Dysmenorrhea: (!) Mild Dysmenorrhea Symptoms: Cramping   Gynecologic History:  Last pap 2020, normal   Upstream - 09/15/22 1407       Pregnancy Intention Screening   Does the patient want to become pregnant in the next year? Yes    Does the patient's partner want to become pregnant in the next year? Yes    Would the patient like to discuss contraceptive options today? No      Contraception Wrap Up   Current Method Hormonal Implant    Contraception Counseling Provided No               OB History  Gravida Para Term Preterm AB Living  3 3 3  0 0 3  SAB IAB Ectopic Multiple Live Births  0 0 0 0 3    # Outcome Date GA Lbr Len/2nd Weight Sex Delivery Anes PTL Lv  3 Term 02/02/18 [redacted]w[redacted]d  6 lb 4.9 oz (2.86 kg) F CS-LTranv Spinal  LIV     Name: [facility] Iysha     Apgar1: 9  Apgar5: 10  2 Term 07/04/16 [redacted]w[redacted]d  8 lb 1.5 oz (3.67 kg) M CS-LTranv Spinal  LIV     Name: Ferrall,PENDINGBABY     Apgar1: 9  Apgar5: 10  1 Term 2013 [redacted]w[redacted]d  8 lb (3.629 kg) M CS-LTranv   LIV    Past Medical History:  Diagnosis Date   Anxiety    Depression    H/O gonorrhea 09/2015   treated 11/2015    Past Surgical History:  Procedure Laterality Date   CESAREAN SECTION     CESAREAN SECTION N/A 07/04/2016    Procedure: CESAREAN SECTION/Boy, 8lbs, 1 oz;  Surgeon: Hildred Laser, MD;  Location: [facility] ORS;  Service: Obstetrics;  Laterality: N/A;   CESAREAN SECTION WITH BILATERAL TUBAL LIGATION Bilateral 02/02/2018   Procedure: CESAREAN SECTION WITH BILATERAL TUBAL LIGATION;  Surgeon: Hildred Laser, MD;  Location: [facility];  Service: Obstetrics;  Laterality: Bilateral;   EAR FOREIGN BODY REMOVAL     63 rocks removed from ear canal. surgery x3    Family History  Problem Relation Age of Onset   Hypertension Mother    Glaucoma Father    Heart disease Maternal Grandmother        heart attack   Cancer Neg Hx    Diabetes Neg Hx     Social History   Socioeconomic History   Marital status: Legally Separated    Spouse name: Not on file   Number of children: Not on file   Years of education: Not on file   Highest education level: Not on file  Occupational History   Occupation: stay at home  Tobacco Use   Smoking status: Former    Years: 1    Types: Cigarettes    Quit date:  2012    Years since quitting: 12.2   Smokeless tobacco: Never   Tobacco comments:    1-2 cig/day  Vaping Use   Vaping Use: Never used  Substance and Sexual Activity   Alcohol use: Not Currently    Alcohol/week: 0.0 standard drinks of alcohol   Drug use: No   Sexual activity: Yes    Partners: Male    Birth control/protection: Implant  Other Topics Concern   Not on file  Social History Narrative   Not on file   Social Determinants of Health   Financial Resource Strain: Not on file  Food Insecurity: Not on file  Transportation Needs: Not on file  Physical Activity: Not on file  Stress: Not on file  Social Connections: Not on file  Intimate Partner Violence: Not on file    Current Outpatient Medications on File Prior to Visit  Medication Sig Dispense Refill   ARIPiprazole (ABILIFY) 2 MG tablet Take by mouth.     escitalopram (LEXAPRO) 20 MG tablet Take by mouth.     etonogestrel (NEXPLANON) 68 MG IMPL implant  1 each by Subdermal route once. Inserted 04/09/2019     estradiol (ESTRACE) 1 MG tablet Take 1 tablet (1 mg total) by mouth daily. Take 1 tablet daily for 2 weeks.  Then stopp.  Repeat again the following month if bleeding continues. 28 tablet 0   nystatin cream (MYCOSTATIN) Apply 1 application topically 2 (two) times daily. (Patient not taking: Reported on 09/15/2022) 30 g 0   sertraline (ZOLOFT) 100 MG tablet Take 1 tablet (100 mg total) by mouth daily. 90 tablet 0   Current Facility-Administered Medications on File Prior to Visit  Medication Dose Route Frequency Provider Last Rate Last Admin   etonogestrel (NEXPLANON) implant 68 mg  68 mg Subdermal Once Hildred Laser, MD        No Known Allergies   Review of Systems Constitutional: negative for chills, fatigue, fevers and sweats Eyes: negative for irritation, redness and visual disturbance Ears, nose, mouth, throat, and face: negative for hearing loss, nasal congestion, snoring and tinnitus Respiratory: negative for asthma, cough, sputum Cardiovascular: negative for chest pain, dyspnea, exertional chest pressure/discomfort, irregular heart beat, palpitations and syncope Gastrointestinal: negative for abdominal pain, change in bowel habits, nausea and vomiting Genitourinary: negative for abnormal menstrual periods, genital lesions, sexual problems and vaginal discharge, dysuria and urinary incontinence Integument/breast: negative for breast lump, breast tenderness and nipple discharge Hematologic/lymphatic: negative for bleeding and easy bruising Musculoskeletal:negative for back pain and muscle weakness Neurological: negative for dizziness, headaches, vertigo and weakness Endocrine: negative for diabetic symptoms including polydipsia, polyuria and skin dryness Allergic/Immunologic: negative for hay fever and urticaria      Objective:  Blood pressure 130/88, height 5\' 5"  (1.651 m), weight 180 lb (81.6 kg), last menstrual period  08/24/2022, currently breastfeeding. Body mass index is 29.95 kg/m.    General Appearance:    Alert, cooperative, no distress, appears stated age  Head:    Normocephalic, without obvious abnormality, atraumatic  Eyes:    PERRL, conjunctiva/corneas clear, EOM's intact, both eyes  Ears:    Normal external ear canals, both ears  Nose:   Nares normal, septum midline, mucosa normal, no drainage or sinus tenderness  Throat:   Lips, mucosa, and tongue normal; teeth and gums normal  Neck:   Supple, symmetrical, trachea midline, no adenopathy; thyroid: no enlargement/tenderness/nodules; no carotid bruit or JVD  Back:     Symmetric, no curvature, ROM normal, no CVA tenderness  Lungs:     Clear to auscultation bilaterally, respirations unlabored  Chest Wall:    No tenderness or deformity   Heart:    Regular rate and rhythm, S1 and S2 normal, no murmur, rub or gallop  Breast Exam:    No tenderness, masses, or nipple abnormality  Abdomen:     Soft, non-tender, bowel sounds active all four quadrants, no masses, no organomegaly.    Genitalia:    Pelvic:external genitalia normal, vagina without lesions, discharge, or tenderness, rectovaginal septum  normal. Cervix normal in appearance, no cervical motion tenderness, no adnexal masses or tenderness.  Uterus normal size, shape, mobile, regular contours, nontender. Acanthosis nigricans noted (She is aware that she is "pre diabetic" and is working on diet)  Rectal:    Normal external sphincter.  No hemorrhoids appreciated. Internal exam not done.   Extremities:   Extremities normal, atraumatic, no cyanosis or edema  Pulses:   2+ and symmetric all extremities  Skin:   Skin color, texture, turgor normal, no rashes or lesions  Lymph nodes:   Cervical, supraclavicular, and axillary nodes normal  Neurologic:   CNII-XII intact, normal strength, sensation and reflexes throughout   .Consent was signed and time out was done. After establishing the position of the  Nexplanon in her left arm, I sprayed the area with Hurricaine spray.  I then prepped the area with betadine.  The area was infiltrated with 2 cc of 1% lidocaine. A small incision was made and the intact rod was easily removed and noted to be intact.  A steristrip was placed and her arm was noted to be hemostatic. It was bandaged.  She tolerated the procedure well.   Labs:  Lab Results  Component Value Date   WBC 8.2 11/16/2020   HGB 15.4 11/16/2020   HCT 45.5 11/16/2020   MCV 88 11/16/2020   PLT 280 11/16/2020    Lab Results  Component Value Date   CREATININE 0.75 11/16/2020   BUN 7 11/16/2020   NA 142 11/16/2020   K 4.5 11/16/2020   CL 106 11/16/2020   CO2 22 11/16/2020    Lab Results  Component Value Date   ALT 29 11/16/2020   AST 20 11/16/2020   ALKPHOS 56 11/16/2020   BILITOT 0.2 11/16/2020    Lab Results  Component Value Date   TSH 0.506 12/19/2018     Assessment:   Well woman exam   Plan:   Pap smear ordered.  Follow up in 1 year for annual exam   Allie Bossierove, Halynn Reitano C, MD [facility]

## 2022-09-16 ENCOUNTER — Encounter: Payer: Self-pay | Admitting: Obstetrics & Gynecology

## 2022-09-16 ENCOUNTER — Ambulatory Visit: Payer: Medicaid Other | Admitting: Obstetrics and Gynecology

## 2022-09-16 ENCOUNTER — Encounter: Payer: Self-pay | Admitting: Obstetrics and Gynecology

## 2022-09-20 LAB — CYTOLOGY - PAP: Diagnosis: NEGATIVE

## 2022-09-21 ENCOUNTER — Ambulatory Visit: Payer: Medicaid Other | Admitting: Obstetrics and Gynecology
